# Patient Record
Sex: Male | Born: 1960 | Race: White | Hispanic: No | Marital: Married | Smoking: Current every day smoker
Health system: Southern US, Community
[De-identification: ages and names within clinical notes are randomized; demographics above are authoritative.]

## PROBLEM LIST (undated history)

## (undated) ENCOUNTER — Emergency Department (HOSPITAL_COMMUNITY): Discharge status: Against Medical Advice | Payer: Self-pay

## (undated) DIAGNOSIS — F32A Depression, unspecified: Secondary | ICD-10-CM

## (undated) DIAGNOSIS — G4730 Sleep apnea, unspecified: Secondary | ICD-10-CM

## (undated) DIAGNOSIS — N189 Chronic kidney disease, unspecified: Secondary | ICD-10-CM

## (undated) DIAGNOSIS — D649 Anemia, unspecified: Secondary | ICD-10-CM

## (undated) DIAGNOSIS — E785 Hyperlipidemia, unspecified: Secondary | ICD-10-CM

## (undated) DIAGNOSIS — E119 Type 2 diabetes mellitus without complications: Secondary | ICD-10-CM

## (undated) DIAGNOSIS — K635 Polyp of colon: Secondary | ICD-10-CM

## (undated) DIAGNOSIS — K649 Unspecified hemorrhoids: Secondary | ICD-10-CM

## (undated) DIAGNOSIS — J449 Chronic obstructive pulmonary disease, unspecified: Secondary | ICD-10-CM

## (undated) DIAGNOSIS — F419 Anxiety disorder, unspecified: Secondary | ICD-10-CM

## (undated) DIAGNOSIS — F329 Major depressive disorder, single episode, unspecified: Secondary | ICD-10-CM

## (undated) DIAGNOSIS — M1990 Unspecified osteoarthritis, unspecified site: Secondary | ICD-10-CM

## (undated) HISTORY — DX: Type 2 diabetes mellitus without complications: E11.9

## (undated) HISTORY — DX: Anxiety disorder, unspecified: F41.9

## (undated) HISTORY — DX: Major depressive disorder, single episode, unspecified: F32.9

## (undated) HISTORY — DX: Depression, unspecified: F32.A

## (undated) HISTORY — DX: Sleep apnea, unspecified: G47.30

## (undated) HISTORY — DX: Hyperlipidemia, unspecified: E78.5

## (undated) HISTORY — DX: Polyp of colon: K63.5

## (undated) HISTORY — DX: Unspecified hemorrhoids: K64.9

---

## 1982-06-22 HISTORY — PX: LIPOMA EXCISION: SHX5283

## 1986-06-22 HISTORY — PX: NASAL FRACTURE SURGERY: SHX718

## 2000-06-22 ENCOUNTER — Encounter: Payer: Self-pay | Admitting: Emergency Medicine

## 2000-06-22 ENCOUNTER — Emergency Department (HOSPITAL_COMMUNITY): Admission: EM | Admit: 2000-06-22 | Discharge: 2000-06-23 | Payer: Self-pay | Admitting: Emergency Medicine

## 2005-08-19 ENCOUNTER — Ambulatory Visit: Payer: Self-pay | Admitting: Internal Medicine

## 2007-01-15 ENCOUNTER — Emergency Department: Payer: Self-pay

## 2011-10-13 ENCOUNTER — Ambulatory Visit: Payer: Self-pay | Admitting: Family Medicine

## 2011-10-21 ENCOUNTER — Ambulatory Visit: Payer: Self-pay | Admitting: Family Medicine

## 2013-02-16 ENCOUNTER — Emergency Department: Payer: Self-pay | Admitting: Emergency Medicine

## 2013-02-18 LAB — BETA STREP CULTURE(ARMC)

## 2013-07-18 ENCOUNTER — Ambulatory Visit: Payer: Self-pay | Admitting: Family Medicine

## 2014-02-16 ENCOUNTER — Ambulatory Visit: Payer: Self-pay | Admitting: Gastroenterology

## 2014-06-22 DIAGNOSIS — K635 Polyp of colon: Secondary | ICD-10-CM

## 2014-06-22 HISTORY — DX: Polyp of colon: K63.5

## 2014-06-22 HISTORY — PX: COLONOSCOPY: SHX174

## 2014-09-22 DIAGNOSIS — R0789 Other chest pain: Secondary | ICD-10-CM | POA: Insufficient documentation

## 2014-09-22 DIAGNOSIS — F17200 Nicotine dependence, unspecified, uncomplicated: Secondary | ICD-10-CM | POA: Insufficient documentation

## 2014-09-22 DIAGNOSIS — G4733 Obstructive sleep apnea (adult) (pediatric): Secondary | ICD-10-CM | POA: Insufficient documentation

## 2014-09-22 DIAGNOSIS — E785 Hyperlipidemia, unspecified: Secondary | ICD-10-CM

## 2014-09-22 DIAGNOSIS — Z1331 Encounter for screening for depression: Secondary | ICD-10-CM | POA: Insufficient documentation

## 2014-09-22 DIAGNOSIS — R202 Paresthesia of skin: Secondary | ICD-10-CM | POA: Insufficient documentation

## 2014-09-22 DIAGNOSIS — D72820 Lymphocytosis (symptomatic): Secondary | ICD-10-CM | POA: Insufficient documentation

## 2014-09-22 DIAGNOSIS — Z23 Encounter for immunization: Secondary | ICD-10-CM | POA: Insufficient documentation

## 2014-09-22 DIAGNOSIS — E559 Vitamin D deficiency, unspecified: Secondary | ICD-10-CM | POA: Insufficient documentation

## 2014-09-22 DIAGNOSIS — E1169 Type 2 diabetes mellitus with other specified complication: Secondary | ICD-10-CM | POA: Insufficient documentation

## 2014-09-22 DIAGNOSIS — E669 Obesity, unspecified: Secondary | ICD-10-CM | POA: Insufficient documentation

## 2014-11-22 ENCOUNTER — Ambulatory Visit (INDEPENDENT_AMBULATORY_CARE_PROVIDER_SITE_OTHER): Payer: BLUE CROSS/BLUE SHIELD | Admitting: Family Medicine

## 2014-11-22 ENCOUNTER — Encounter: Payer: Self-pay | Admitting: Family Medicine

## 2014-11-22 VITALS — BP 100/68 | HR 92 | Resp 15 | Ht 69.5 in | Wt 209.4 lb

## 2014-11-22 DIAGNOSIS — J343 Hypertrophy of nasal turbinates: Secondary | ICD-10-CM

## 2014-11-22 DIAGNOSIS — H938X2 Other specified disorders of left ear: Secondary | ICD-10-CM | POA: Diagnosis not present

## 2014-11-22 DIAGNOSIS — E119 Type 2 diabetes mellitus without complications: Secondary | ICD-10-CM

## 2014-11-22 DIAGNOSIS — F329 Major depressive disorder, single episode, unspecified: Secondary | ICD-10-CM

## 2014-11-22 DIAGNOSIS — F419 Anxiety disorder, unspecified: Secondary | ICD-10-CM | POA: Diagnosis not present

## 2014-11-22 DIAGNOSIS — E785 Hyperlipidemia, unspecified: Secondary | ICD-10-CM | POA: Insufficient documentation

## 2014-11-22 DIAGNOSIS — F32A Depression, unspecified: Secondary | ICD-10-CM

## 2014-11-22 MED ORDER — CITALOPRAM HYDROBROMIDE 20 MG PO TABS: 20.0000 mg | ORAL_TABLET | Freq: Every day | ORAL | Status: DC

## 2014-11-22 MED ORDER — ROSUVASTATIN CALCIUM 20 MG PO TABS: 20.0000 mg | ORAL_TABLET | Freq: Every day | ORAL | Status: DC

## 2014-11-22 MED ORDER — FLUTICASONE PROPIONATE 50 MCG/ACT NA SUSP: 2.0000 | Freq: Every day | NASAL | Status: DC

## 2014-11-22 MED ORDER — ALPRAZOLAM 0.5 MG PO TABS: 0.5000 mg | ORAL_TABLET | Freq: Every day | ORAL | Status: DC | PRN

## 2014-11-22 MED ORDER — METFORMIN HCL 1000 MG PO TABS: 1000.0000 mg | ORAL_TABLET | Freq: Two times a day (BID) | ORAL | Status: DC

## 2014-11-22 NOTE — Progress Notes (Signed)
Subjective:    Patient ID: Trevor Young, male    DOB: [birth year], 54 y.o.   MRN: [id number]  Hyperlipidemia This is a chronic problem. Exacerbating diseases include diabetes and obesity. Pertinent negatives include no focal weakness, leg pain or shortness of breath. Current antihyperlipidemic treatment includes statins. The current treatment provides significant improvement of lipids. There are no compliance problems.  Risk factors for coronary artery disease include diabetes mellitus, dyslipidemia, male sex and obesity.  Anxiety Presents for follow-up visit. Symptoms include malaise and nervous/anxious behavior. Patient reports no dizziness, feeling of choking, obsessions, palpitations, panic, restlessness or shortness of breath. Symptoms occur occasionally. The severity of symptoms is mild. The symptoms are aggravated by work stress. The quality of sleep is good.   His past medical history is significant for depression. Past treatments include benzodiazephines and SSRIs. The treatment provided significant relief. Compliance with prior treatments has been good.  Diabetes He presents for his follow-up diabetic visit. He has type 2 diabetes mellitus. Hypoglycemia symptoms include nervousness/anxiousness. Pertinent negatives for hypoglycemia include no dizziness. Associated symptoms include polydipsia and polyuria. There are no hypoglycemic complications. Symptoms are stable. Pertinent negatives for diabetic complications include no autonomic neuropathy, CVA, heart disease or PVD. Risk factors for coronary artery disease include diabetes mellitus, dyslipidemia, male sex, obesity, stress and family history. Current diabetic treatment includes oral agent (monotherapy). He is compliant with treatment most of the time. He is following a diabetic diet. He has not had a previous visit with a dietitian. He rarely participates in exercise. He monitors blood glucose at home 1-2 x per day. There is no change in  his home blood glucose trend. His breakfast blood glucose is taken between 5-6 am. His breakfast blood glucose range is generally 110-130 mg/dl. An ACE inhibitor/angiotensin II receptor blocker is not being taken. He does not see a podiatrist.Eye exam is not current.  Ear Fullness  There is pain in the left ear. This is a recurrent problem. The current episode started more than 1 month ago. There has been no fever. The pain is at a severity of 1/10. The pain is mild. Pertinent negatives include no ear discharge or rhinorrhea. He has tried antibiotics for the symptoms. The treatment provided no relief. There is no history of a chronic ear infection, hearing loss or a tympanostomy tube.   Past Medical History  Diagnosis Date  . Hyperlipidemia   . Diabetes mellitus without complication   . Anxiety   . Depression    History reviewed. No pertinent past surgical history. Family History  Problem Relation Age of Onset  . Hypertension Mother   . Diabetes Mother   . Hypertension Father   . Diabetes Brother   . Diabetes Maternal Grandmother     History   Social History  . Marital Status: Married    Spouse Name: N/A  . Number of Children: N/A  . Years of Education: N/A   Occupational History  . Not on file.   Social History Main Topics  . Smoking status: Current Every Day Smoker -- 0.50 packs/day for 45 years  . Smokeless tobacco: Current User  . Alcohol Use: No  . Drug Use: No  . Sexual Activity: Not on file   Other Topics Concern  . Not on file   Social History Narrative     Review of Systems  HENT: Positive for ear pain. Negative for ear discharge and rhinorrhea.   Respiratory: Negative for shortness of breath.   Cardiovascular: Negative  for palpitations.  Endocrine: Positive for polydipsia and polyuria.  Neurological: Negative for dizziness and focal weakness.  Psychiatric/Behavioral: The patient is nervous/anxious.        Objective:   Physical Exam  Constitutional:  Vital signs are normal. He appears well-developed and well-nourished.  HENT:  Right Ear: External ear normal.  Left Ear: Hearing normal. No tenderness. No foreign bodies.  Mouth/Throat: Oropharynx is clear and moist.  eryhtematous L. external ear canal.  Left nasal turbinate erythematous and hypertrophied more than right nasal turbinate.  Cardiovascular: Normal rate, regular rhythm and normal heart sounds.   Pulmonary/Chest: Effort normal and breath sounds normal.  Neurological: He is alert.  Psychiatric: He has a normal mood and affect. His behavior is normal. Judgment and thought content normal.          Assessment & Plan:  1. Anxiety  - ALPRAZolam (XANAX) 0.5 MG tablet; Take 1 tablet (0.5 mg total) by mouth daily as needed.  Dispense: 30 tablet; Refill: 0  2. Type 2 diabetes mellitus without complication  - metFORMIN (GLUCOPHAGE) 1000 MG tablet; Take 1 tablet (1,000 mg total) by mouth 2 (two) times daily with a meal.  Dispense: 60 tablet; Refill: 2 - HgB A1c  3. Hyperlipidemia - rosuvastatin (CRESTOR) 20 MG tablet; Take 1 tablet (20 mg total) by mouth at bedtime.  Dispense: 30 tablet; Refill: 2 - Lipid Profile - Comp Met (CMET)  4. Depression  - citalopram (CELEXA) 20 MG tablet; Take 1 tablet (20 mg total) by mouth daily.  Dispense: 30 tablet; Refill: 2  5. Sensation of fullness in left ear Pt. Has been treated with antibiotics in the past, which has not helped relieve his symptoms. He will be referred to ENT for further evaluation. - Ambulatory referral to ENT  6. Nasal turbinate hypertrophy Pt. To use Flonase for 5 days for relief of nasal turbinate hypertrophy. Follow up with ENT. - fluticasone (FLONASE) 50 MCG/ACT nasal spray; Place 2 sprays into both nostrils daily.  Dispense: 16 g; Refill: 0

## 2014-12-04 ENCOUNTER — Telehealth: Payer: Self-pay

## 2014-12-04 DIAGNOSIS — F419 Anxiety disorder, unspecified: Secondary | ICD-10-CM

## 2014-12-04 NOTE — Telephone Encounter (Signed)
Spoke with patient to notify him of his ENT appointment while speaking with patient he wanted to know if I would ask if someone to could contact him in reference to why Dr. Sherryll Burger didn't refill his xanax.

## 2014-12-05 ENCOUNTER — Telehealth: Payer: Self-pay | Admitting: Family Medicine

## 2014-12-05 NOTE — Telephone Encounter (Signed)
Spoke with patients wife and told her that according to chart med was printed and gave to patient.  Wife reported that patient throw away rx.  Patients wife is going to try and find rx.

## 2014-12-05 NOTE — Telephone Encounter (Signed)
Pt has misplaced his Alprazolam 0.5mg . Patient remembers seeing the prescription but now can not locate it. Please advise [phone]

## 2014-12-06 MED ORDER — ALPRAZOLAM 0.5 MG PO TABS: 0.5000 mg | ORAL_TABLET | Freq: Every day | ORAL | Status: DC | PRN

## 2014-12-06 NOTE — Telephone Encounter (Signed)
This was a duplicate message.  I copy and paste this message and place it in first message.

## 2014-12-06 NOTE — Telephone Encounter (Signed)
Trevor Young at 12/05/2014 3:28 PM     Status: Signed       Expand All Collapse All   Pt has misplaced his Alprazolam 0.5mg . Patient remembers seeing the prescription but now can not locate it. Please advise [phone]
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
## 2015-02-19 ENCOUNTER — Telehealth: Payer: Self-pay | Admitting: Family Medicine

## 2015-02-19 DIAGNOSIS — J343 Hypertrophy of nasal turbinates: Secondary | ICD-10-CM

## 2015-02-19 MED ORDER — FLUTICASONE PROPIONATE 50 MCG/ACT NA SUSP: 2.0000 | Freq: Every day | NASAL | Status: DC

## 2015-02-19 NOTE — Telephone Encounter (Signed)
Fluticasone Propiona 50 MCG has been refilled and sent to [facility]

## 2015-02-26 ENCOUNTER — Ambulatory Visit: Payer: BLUE CROSS/BLUE SHIELD | Admitting: Family Medicine

## 2015-02-27 ENCOUNTER — Ambulatory Visit: Payer: BLUE CROSS/BLUE SHIELD | Admitting: Family Medicine

## 2015-03-04 ENCOUNTER — Ambulatory Visit: Payer: BLUE CROSS/BLUE SHIELD | Admitting: Family Medicine

## 2015-03-05 ENCOUNTER — Ambulatory Visit: Payer: BLUE CROSS/BLUE SHIELD | Admitting: Family Medicine

## 2015-03-05 ENCOUNTER — Telehealth: Payer: Self-pay | Admitting: Family Medicine

## 2015-03-05 DIAGNOSIS — F419 Anxiety disorder, unspecified: Secondary | ICD-10-CM

## 2015-03-05 DIAGNOSIS — F329 Major depressive disorder, single episode, unspecified: Secondary | ICD-10-CM

## 2015-03-05 DIAGNOSIS — F32A Depression, unspecified: Secondary | ICD-10-CM

## 2015-03-05 DIAGNOSIS — E119 Type 2 diabetes mellitus without complications: Secondary | ICD-10-CM

## 2015-03-05 DIAGNOSIS — E785 Hyperlipidemia, unspecified: Secondary | ICD-10-CM

## 2015-03-05 DIAGNOSIS — J343 Hypertrophy of nasal turbinates: Secondary | ICD-10-CM

## 2015-03-05 MED ORDER — CITALOPRAM HYDROBROMIDE 20 MG PO TABS: 20.0000 mg | ORAL_TABLET | Freq: Every day | ORAL | Status: DC

## 2015-03-05 MED ORDER — METFORMIN HCL 1000 MG PO TABS: 1000.0000 mg | ORAL_TABLET | Freq: Two times a day (BID) | ORAL | Status: DC

## 2015-03-05 MED ORDER — ALPRAZOLAM 0.5 MG PO TABS: 0.5000 mg | ORAL_TABLET | Freq: Every day | ORAL | Status: DC | PRN

## 2015-03-05 MED ORDER — FLUTICASONE PROPIONATE 50 MCG/ACT NA SUSP: 2.0000 | Freq: Every day | NASAL | Status: DC

## 2015-03-05 MED ORDER — ROSUVASTATIN CALCIUM 20 MG PO TABS: 20.0000 mg | ORAL_TABLET | Freq: Every day | ORAL | Status: DC

## 2015-03-05 NOTE — Telephone Encounter (Signed)
Medication has been refilled and sent to [facility]. °

## 2015-03-05 NOTE — Telephone Encounter (Signed)
Pt is currently out of all medications and has had to be rescheduled several times. He now has an appt on 03/19/15

## 2015-03-07 ENCOUNTER — Ambulatory Visit: Payer: BLUE CROSS/BLUE SHIELD | Admitting: Family Medicine

## 2015-03-19 ENCOUNTER — Ambulatory Visit (INDEPENDENT_AMBULATORY_CARE_PROVIDER_SITE_OTHER): Payer: BLUE CROSS/BLUE SHIELD | Admitting: Family Medicine

## 2015-03-19 ENCOUNTER — Encounter: Payer: Self-pay | Admitting: Family Medicine

## 2015-03-19 VITALS — BP 100/60 | HR 71 | Temp 98.2°F | Resp 17 | Ht 70.0 in | Wt 203.4 lb

## 2015-03-19 DIAGNOSIS — E119 Type 2 diabetes mellitus without complications: Secondary | ICD-10-CM | POA: Diagnosis not present

## 2015-03-19 DIAGNOSIS — E785 Hyperlipidemia, unspecified: Secondary | ICD-10-CM | POA: Diagnosis not present

## 2015-03-19 DIAGNOSIS — F419 Anxiety disorder, unspecified: Secondary | ICD-10-CM

## 2015-03-19 DIAGNOSIS — K429 Umbilical hernia without obstruction or gangrene: Secondary | ICD-10-CM | POA: Insufficient documentation

## 2015-03-19 DIAGNOSIS — F329 Major depressive disorder, single episode, unspecified: Secondary | ICD-10-CM | POA: Diagnosis not present

## 2015-03-19 DIAGNOSIS — F32A Depression, unspecified: Secondary | ICD-10-CM

## 2015-03-19 LAB — GLUCOSE, POCT (MANUAL RESULT ENTRY): POC Glucose: 102 mg/dl — AB (ref 70–99)

## 2015-03-19 LAB — POCT GLYCOSYLATED HEMOGLOBIN (HGB A1C): Hemoglobin A1C: 6

## 2015-03-19 MED ORDER — CITALOPRAM HYDROBROMIDE 20 MG PO TABS: 20.0000 mg | ORAL_TABLET | Freq: Every day | ORAL | Status: DC

## 2015-03-19 MED ORDER — METFORMIN HCL 1000 MG PO TABS: 1000.0000 mg | ORAL_TABLET | Freq: Two times a day (BID) | ORAL | Status: DC

## 2015-03-19 MED ORDER — ALPRAZOLAM 0.5 MG PO TABS: 0.5000 mg | ORAL_TABLET | Freq: Every day | ORAL | Status: DC | PRN

## 2015-03-19 MED ORDER — ROSUVASTATIN CALCIUM 20 MG PO TABS: 20.0000 mg | ORAL_TABLET | Freq: Every day | ORAL | Status: DC

## 2015-03-19 NOTE — Progress Notes (Signed)
Name: Trevor Young   MRN: [id number]    DOB: [birth year]   Date:03/19/2015       Progress Note  Subjective  Chief Complaint  Chief Complaint  Patient presents with  . Follow-up    3 mo  . Hyperlipidemia  . Depression  . COPD  . Anxiety  . Diabetes  . Medication Refill    Hyperlipidemia This is a chronic problem. The problem is controlled. Recent lipid tests were reviewed and are normal. Pertinent negatives include no shortness of breath. Current antihyperlipidemic treatment includes statins.  Depression      The patient presents with depression.  This is a chronic problem.  Associated symptoms include no fatigue, no helplessness, no hopelessness, does not have insomnia, not irritable, no appetite change and not sad.  Past treatments include SSRIs - Selective serotonin reuptake inhibitors.  Past medical history includes anxiety and depression.   Anxiety Presents for follow-up visit. Patient reports no depressed mood, excessive worry, insomnia, nervous/anxious behavior, panic or shortness of breath. The quality of sleep is good.   His past medical history is significant for depression. Past treatments include benzodiazephines. Compliance with prior treatments has been good.  Diabetes He presents for his follow-up diabetic visit. He has type 2 diabetes mellitus. His disease course has been stable. Pertinent negatives for hypoglycemia include no nervousness/anxiousness. Pertinent negatives for diabetes include no fatigue, no foot paresthesias, no polydipsia and no polyuria. Current diabetic treatment includes oral agent (monotherapy). He is following a diabetic diet. His breakfast blood glucose range is generally 130-140 mg/dl. His dinner blood glucose range is generally 130-140 mg/dl. An ACE inhibitor/angiotensin II receptor blocker is not being taken. He does not see a podiatrist.Eye exam is not current.    Past Medical History  Diagnosis Date  . Hyperlipidemia   . Diabetes mellitus  without complication   . Anxiety   . Depression     History reviewed. No pertinent past surgical history.  Family History  Problem Relation Age of Onset  . Hypertension Mother   . Diabetes Mother   . Hypertension Father   . Diabetes Brother   . Diabetes Maternal Grandmother     Social History   Social History  . Marital Status: Married    Spouse Name: N/A  . Number of Children: N/A  . Years of Education: N/A   Occupational History  . Not on file.   Social History Main Topics  . Smoking status: Current Every Day Smoker -- 0.50 packs/day for 45 years  . Smokeless tobacco: Current User  . Alcohol Use: No  . Drug Use: No  . Sexual Activity: Not on file   Other Topics Concern  . Not on file   Social History Narrative    Current outpatient prescriptions:  .  ALPRAZolam (XANAX) 0.5 MG tablet, Take 1 tablet (0.5 mg total) by mouth daily as needed., Disp: 30 tablet, Rfl: 0 .  citalopram (CELEXA) 20 MG tablet, Take 1 tablet (20 mg total) by mouth daily., Disp: 30 tablet, Rfl: 0 .  fluticasone (FLONASE) 50 MCG/ACT nasal spray, Place 2 sprays into both nostrils daily., Disp: 16 g, Rfl: 0 .  glucose blood test strip, 1 strip 2 (two) times daily. Use as directed to check BG BID, Disp: , Rfl:  .  metFORMIN (GLUCOPHAGE) 1000 MG tablet, Take 1 tablet (1,000 mg total) by mouth 2 (two) times daily with a meal., Disp: 60 tablet, Rfl: 2 .  rosuvastatin (CRESTOR) 20 MG tablet, Take 1  tablet (20 mg total) by mouth at bedtime., Disp: 30 tablet, Rfl: 2  Allergies  Allergen Reactions  . Sulfa Antibiotics      Review of Systems  Constitutional: Negative for appetite change and fatigue.  Respiratory: Negative for shortness of breath.   Endo/Heme/Allergies: Negative for polydipsia.  Psychiatric/Behavioral: Positive for depression. The patient is not nervous/anxious and does not have insomnia.    Objective  Filed Vitals:   03/19/15 1634  BP: 100/60  Pulse: 71  Temp: 98.2 F (36.8  C)  TempSrc: Oral  Resp: 17  Height:  (1.778 m)  Weight: 203 lb 6.4 oz (92.262 kg)  SpO2: 94%    Physical Exam  Constitutional: He is oriented to person, place, and time and well-developed, well-nourished, and in no distress. He is not irritable.  Cardiovascular: Normal rate and regular rhythm.   Pulmonary/Chest: Effort normal and breath sounds normal.  Abdominal: Soft. Bowel sounds are normal. A hernia is present. Hernia confirmed positive in the umbilical area.  Musculoskeletal: He exhibits no edema.  Neurological: He is alert and oriented to person, place, and time.  Nursing note and vitals reviewed.   Assessment & Plan  1. Type 2 diabetes mellitus without complication A1c of 6.0%, consistent with well-controlled diabetes. - metFORMIN (GLUCOPHAGE) 1000 MG tablet; Take 1 tablet (1,000 mg total) by mouth 2 (two) times daily with a meal.  Dispense: 180 tablet; Refill: 0 - Comprehensive Metabolic Panel (CMET) - POCT HgB A1C - POCT Glucose (CBG)  2. Anxiety Symptoms of anxiety are stable on daily benzodiazepine therapy. - ALPRAZolam (XANAX) 0.5 MG tablet; Take 1 tablet (0.5 mg total) by mouth daily as needed.  Dispense: 30 tablet; Refill: 2  3. Depression  - citalopram (CELEXA) 20 MG tablet; Take 1 tablet (20 mg total) by mouth daily.  Dispense: 90 tablet; Refill: 0  4. Hyperlipidemia  - rosuvastatin (CRESTOR) 20 MG tablet; Take 1 tablet (20 mg total) by mouth at bedtime.  Dispense: 90 tablet; Refill: 0 - Lipid Profile  5. Umbilical hernia without obstruction or gangrene  - Ambulatory referral to General Surgery   Syed Asad A. [facility] 03/19/2015 4:52 PM

## 2015-03-20 ENCOUNTER — Encounter: Payer: Self-pay | Admitting: *Deleted

## 2015-04-02 ENCOUNTER — Encounter: Payer: Self-pay | Admitting: General Surgery

## 2015-04-02 ENCOUNTER — Ambulatory Visit (INDEPENDENT_AMBULATORY_CARE_PROVIDER_SITE_OTHER): Payer: BLUE CROSS/BLUE SHIELD | Admitting: General Surgery

## 2015-04-02 VITALS — BP 130/68 | HR 78 | Resp 14 | Ht 68.0 in | Wt 204.0 lb

## 2015-04-02 DIAGNOSIS — K429 Umbilical hernia without obstruction or gangrene: Secondary | ICD-10-CM | POA: Diagnosis not present

## 2015-04-02 NOTE — Patient Instructions (Addendum)
Hernia, Adult A hernia is the bulging of an organ or tissue through a weak spot in the muscles of the abdomen (abdominal wall). Hernias develop most often near the navel or groin. There are many kinds of hernias. Common kinds include:  Femoral hernia. This kind of hernia develops under the groin in the upper thigh area.  Inguinal hernia. This kind of hernia develops in the groin or scrotum.  Umbilical hernia. This kind of hernia develops near the navel.  Hiatal hernia. This kind of hernia causes part of the stomach to be pushed up into the chest.  Incisional hernia. This kind of hernia bulges through a scar from an abdominal surgery. CAUSES This condition may be caused by:  Heavy lifting.  Coughing over a long period of time.  Straining to have a bowel movement.  An incision made during an abdominal surgery.  A birth defect (congenital defect).  Excess weight or obesity.  Smoking.  Poor nutrition.  Cystic fibrosis.  Excess fluid in the abdomen.  Undescended testicles. SYMPTOMS Symptoms of a hernia include:  A lump on the abdomen. This is the first sign of a hernia. The lump may become more obvious with standing, straining, or coughing. It may get bigger over time if it is not treated or if the condition causing it is not treated.  Pain. A hernia is usually painless, but it may become painful over time if treatment is delayed. The pain is usually dull and may get worse with standing or lifting heavy objects. Sometimes a hernia gets tightly squeezed in the weak spot (strangulated) or stuck there (incarcerated) and causes additional symptoms. These symptoms may include:  Vomiting.  Nausea.  Constipation.  Irritability. DIAGNOSIS A hernia may be diagnosed with:  A physical exam. During the exam your health care provider may ask you to cough or to make a specific movement, because a hernia is usually more visible when you move.  Imaging tests. These can  include:  X-rays.  Ultrasound.  CT scan. TREATMENT A hernia that is small and painless may not need to be treated. A hernia that is large or painful may be treated with surgery. Inguinal hernias may be treated with surgery to prevent incarceration or strangulation. Strangulated hernias are always treated with surgery, because lack of blood to the trapped organ or tissue can cause it to die. Surgery to treat a hernia involves pushing the bulge back into place and repairing the weak part of the abdomen. HOME CARE INSTRUCTIONS  Avoid straining.  Do not lift anything heavier than 10 lb (4.5 kg).  Lift with your leg muscles, not your back muscles. This helps avoid strain.  When coughing, try to cough gently.  Prevent constipation. Constipation leads to straining with bowel movements, which can make a hernia worse or cause a hernia repair to break down. You can prevent constipation by:  Eating a high-fiber diet that includes plenty of fruits and vegetables.  Drinking enough fluids to keep your urine clear or pale yellow. Aim to drink 6-8 glasses of water per day.  Using a stool softener as directed by your health care provider.  Lose weight, if you are overweight.  Do not use any tobacco products, including cigarettes, chewing tobacco, or electronic cigarettes. If you need help quitting, ask your health care provider.  Keep all follow-up visits as directed by your health care provider. This is important. Your health care provider may need to monitor your condition. SEEK MEDICAL CARE IF:  You have   swelling, redness, and pain in the affected area.  Your bowel habits change. SEEK IMMEDIATE MEDICAL CARE IF:  You have a fever.  You have abdominal pain that is getting worse.  You feel nauseous or you vomit.  You cannot push the hernia back in place by gently pressing on it while you are lying down.  The hernia:  Changes in shape or size.  Is stuck outside the  abdomen.  Becomes discolored.  Feels hard or tender.   This information is not intended to replace advice given to you by your health care provider. Make sure you discuss any questions you have with your health care provider.   Document Released: 06/08/2005 Document Revised: 06/29/2014 Document Reviewed: 04/18/2014 Elsevier Interactive Patient Education [employer]  The patient is scheduled for surgery at [facility] on 04/15/15. He will pre admit by phone. The patient is aware of date and instructions.

## 2015-04-02 NOTE — Progress Notes (Addendum)
Patient ID: Trevor Young, male   DOB: [birth year], 55 y.o.   MRN: [id number]  Chief Complaint  Patient presents with  . Hernia    HPI Trevor Young is a 54 y.o. male.  Here for evaluation of a possible umbilical hernia. Bowels area regular and daily. He states he first noticed it several years ago. He states it has gotten larger over the past year and more tender. He states when he bumps it he will get nauseates.   HPI  Past Medical History  Diagnosis Date  . Hyperlipidemia   . Diabetes mellitus without complication (HCC)   . Anxiety   . Depression   . Hemorrhoid   . Colon polyp 2016  . Sleep apnea     Uses C-Pap machine     Past Surgical History  Procedure Laterality Date  . Lipoma excision  1984  . Nasal fracture surgery  1988  . Colonoscopy  2016    Family History  Problem Relation Age of Onset  . Hypertension Mother   . Diabetes Mother   . Hypertension Father   . Diabetes Brother   . Diabetes Maternal Grandmother     Social History Social History  Substance Use Topics  . Smoking status: Current Every Day Smoker -- 1.00 packs/day for 45 years    Types: Cigarettes  . Smokeless tobacco: Current User  . Alcohol Use: 0.0 oz/week    0 Standard drinks or equivalent per week     Comment: occasionally    Allergies  Allergen Reactions  . Sulfa Antibiotics     Current Outpatient Prescriptions  Medication Sig Dispense Refill  . ALPRAZolam (XANAX) 0.5 MG tablet Take 1 tablet (0.5 mg total) by mouth daily as needed. 30 tablet 2  . citalopram (CELEXA) 20 MG tablet Take 1 tablet (20 mg total) by mouth daily. 90 tablet 0  . fluticasone (FLONASE) 50 MCG/ACT nasal spray Place 2 sprays into both nostrils daily. 16 g 0  . glucose blood test strip 1 strip 2 (two) times daily. Use as directed to check BG BID    . metFORMIN (GLUCOPHAGE) 1000 MG tablet Take 1 tablet (1,000 mg total) by mouth 2 (two) times daily with a meal. 180 tablet 0  . rosuvastatin (CRESTOR) 20 MG tablet  Take 1 tablet (20 mg total) by mouth at bedtime. 90 tablet 0   No current facility-administered medications for this visit.    Review of Systems Review of Systems  Constitutional: Negative.   Respiratory: Positive for shortness of breath.   Cardiovascular: Negative.   Gastrointestinal: Negative for diarrhea and constipation.    Blood pressure 130/68, pulse 78, resp. rate 14, height  (1.727 m), weight 204 lb (92.534 kg).  Physical Exam Physical Exam  Constitutional: He is oriented to person, place, and time. He appears well-developed and well-nourished.  HENT:  Mouth/Throat: Oropharynx is clear and moist.  Eyes: Conjunctivae are normal. No scleral icterus.  Neck: Neck supple.  Cardiovascular: Normal rate, regular rhythm and normal heart sounds.   Pulmonary/Chest: Effort normal and breath sounds normal.  Abdominal: Soft. Normal appearance and bowel sounds are normal. A hernia (umbilical) is present. Hernia confirmed negative in the right inguinal area and confirmed negative in the left inguinal area.  Neurological: He is alert and oriented to person, place, and time.  Skin: Skin is warm and dry.  Psychiatric: His behavior is normal.    Data Reviewed None.  Assessment    Umbilical hernia without strangulation or  gangrene    Plan    Hernia precautions and incarceration were discussed with the patient. If they develop symptoms of an incarcerated hernia, they were encouraged to seek prompt medical attention.  I have recommended repair of the hernia possibly using mesh on an outpatient basis in the near future. The risk of infection was reviewed. The role of prosthetic mesh to minimize the risk of recurrence was reviewed.     The patient [facility]. He will pre admit by phone. The patient is aware of date and instructions.   PCP:  Carnella GuadalajaraShah, Syed Asad A  SANKAR,SEEPLAPUTHUR G 04/10/2015, 3:33 PM

## 2015-04-09 ENCOUNTER — Other Ambulatory Visit: Payer: Self-pay

## 2015-04-11 ENCOUNTER — Encounter: Payer: Self-pay | Admitting: *Deleted

## 2015-04-11 NOTE — Patient Instructions (Signed)
  Your procedure is scheduled on: 04-15-15 Report to [facility] 2ND FLOOR To find out your arrival time please call [phone] between 1PM - 3PM on 04-12-15  Remember: Instructions that are not followed completely may result in serious medical risk, up to and including death, or upon the discretion of your surgeon and anesthesiologist your surgery may need to be rescheduled.    _X___ 1. Do not eat food or drink liquids after midnight. No gum chewing or hard candies.     _X___ 2. No Alcohol for 24 hours before or after surgery.   ____ 3. Bring all medications with you on the day of surgery if instructed.    ____ 4. Notify your doctor if there is any change in your medical condition     (cold, fever, infections).     Do not wear jewelry, make-up, hairpins, clips or nail polish.  Do not wear lotions, powders, or perfumes. You may wear deodorant.  Do not shave 48 hours prior to surgery. Men may shave face and neck.  Do not bring valuables to the hospital.    [facility] is not responsible for any belongings or valuables.               Contacts, dentures or bridgework may not be worn into surgery.  Leave your suitcase in the car. After surgery it may be brought to your room.  For patients admitted to the hospital, discharge time is determined by your treatment team.   Patients discharged the day of surgery will not be allowed to drive home.   Please read over the following fact sheets that you were given:      _X___ Take these medicines the morning of surgery with A SIP OF WATER:    1. CITALOPRAM  2.   3.   4.  5.  6.  ____ Fleet Enema (as directed)   ____ Use CHG Soap as directed  ____ Use inhalers on the day of surgery  _X___ Stop metformin 2 days prior to surgery-LAST DOSE ON 04-12-15 (FRIDAY)    ____ Take 1/2 of usual insulin dose the night before surgery and none on the morning of surgery.   ____ Stop Coumadin/Plavix/aspirin-N/A  ____ Stop  Anti-inflammatories-NO NSAIDS OR ASA PRODUCTS-TYLENOL OK   ____ Stop supplements until after surgery.    ____ Bring C-Pap to the hospital.

## 2015-04-15 ENCOUNTER — Ambulatory Visit
Admission: RE | Admit: 2015-04-15 | Discharge: 2015-04-15 | Disposition: A | Discharge status: Home / Self Care | Payer: BLUE CROSS/BLUE SHIELD | Source: Ambulatory Visit | Attending: General Surgery | Admitting: General Surgery

## 2015-04-15 ENCOUNTER — Encounter: Payer: Self-pay | Admitting: Anesthesiology

## 2015-04-15 ENCOUNTER — Ambulatory Visit: Payer: BLUE CROSS/BLUE SHIELD | Admitting: Anesthesiology

## 2015-04-15 ENCOUNTER — Encounter
Admission: RE | Disposition: A | Discharge status: Home / Self Care | Payer: Self-pay | Source: Ambulatory Visit | Attending: General Surgery

## 2015-04-15 DIAGNOSIS — G4730 Sleep apnea, unspecified: Secondary | ICD-10-CM | POA: Diagnosis not present

## 2015-04-15 DIAGNOSIS — Z9889 Other specified postprocedural states: Secondary | ICD-10-CM | POA: Insufficient documentation

## 2015-04-15 DIAGNOSIS — F329 Major depressive disorder, single episode, unspecified: Secondary | ICD-10-CM | POA: Insufficient documentation

## 2015-04-15 DIAGNOSIS — Z8249 Family history of ischemic heart disease and other diseases of the circulatory system: Secondary | ICD-10-CM | POA: Insufficient documentation

## 2015-04-15 DIAGNOSIS — K429 Umbilical hernia without obstruction or gangrene: Secondary | ICD-10-CM | POA: Diagnosis not present

## 2015-04-15 DIAGNOSIS — E119 Type 2 diabetes mellitus without complications: Secondary | ICD-10-CM | POA: Insufficient documentation

## 2015-04-15 DIAGNOSIS — Z86010 Personal history of colonic polyps: Secondary | ICD-10-CM | POA: Insufficient documentation

## 2015-04-15 DIAGNOSIS — Z7984 Long term (current) use of oral hypoglycemic drugs: Secondary | ICD-10-CM | POA: Diagnosis not present

## 2015-04-15 DIAGNOSIS — Z833 Family history of diabetes mellitus: Secondary | ICD-10-CM | POA: Diagnosis not present

## 2015-04-15 DIAGNOSIS — E785 Hyperlipidemia, unspecified: Secondary | ICD-10-CM | POA: Diagnosis not present

## 2015-04-15 DIAGNOSIS — Z79899 Other long term (current) drug therapy: Secondary | ICD-10-CM | POA: Diagnosis not present

## 2015-04-15 DIAGNOSIS — M1990 Unspecified osteoarthritis, unspecified site: Secondary | ICD-10-CM | POA: Diagnosis not present

## 2015-04-15 DIAGNOSIS — F17210 Nicotine dependence, cigarettes, uncomplicated: Secondary | ICD-10-CM | POA: Insufficient documentation

## 2015-04-15 DIAGNOSIS — F419 Anxiety disorder, unspecified: Secondary | ICD-10-CM | POA: Diagnosis not present

## 2015-04-15 HISTORY — DX: Anemia, unspecified: D64.9

## 2015-04-15 HISTORY — DX: Unspecified osteoarthritis, unspecified site: M19.90

## 2015-04-15 HISTORY — DX: Chronic obstructive pulmonary disease, unspecified: J44.9

## 2015-04-15 HISTORY — PX: UMBILICAL HERNIA REPAIR: SHX196

## 2015-04-15 HISTORY — DX: Chronic kidney disease, unspecified: N18.9

## 2015-04-15 LAB — GLUCOSE, CAPILLARY
Glucose-Capillary: 145 mg/dL — ABNORMAL HIGH (ref 65–99)
Glucose-Capillary: 132 mg/dL — ABNORMAL HIGH (ref 65–99)

## 2015-04-15 SURGERY — REPAIR, HERNIA, UMBILICAL, ADULT
Anesthesia: General | Wound class: Clean

## 2015-04-15 MED ORDER — ACETAMINOPHEN 10 MG/ML IV SOLN
INTRAVENOUS | Status: AC
  Filled 2015-04-15: qty 100

## 2015-04-15 MED ORDER — CEFAZOLIN SODIUM-DEXTROSE 2-3 GM-% IV SOLR
2.0000 g | INTRAVENOUS | Status: AC
  Administered 2015-04-15 (×2): 2 g via INTRAVENOUS

## 2015-04-15 MED ORDER — ONDANSETRON HCL 4 MG PO TABS: 4.0000 mg | ORAL_TABLET | Freq: Three times a day (TID) | ORAL | Status: DC | PRN

## 2015-04-15 MED ORDER — IPRATROPIUM-ALBUTEROL 0.5-2.5 (3) MG/3ML IN SOLN: 3.0000 mL | Freq: Once | RESPIRATORY_TRACT | Status: DC | PRN

## 2015-04-15 MED ORDER — GLYCOPYRROLATE 0.2 MG/ML IJ SOLN
INTRAMUSCULAR | Status: DC | PRN
  Administered 2015-04-15: 0.2 mg via INTRAVENOUS

## 2015-04-15 MED ORDER — DEXAMETHASONE SODIUM PHOSPHATE 4 MG/ML IJ SOLN
INTRAMUSCULAR | Status: DC | PRN
  Administered 2015-04-15: 5 mg via INTRAVENOUS

## 2015-04-15 MED ORDER — CHLORHEXIDINE GLUCONATE 4 % EX LIQD: 1.0000 "application " | Freq: Once | CUTANEOUS | Status: DC

## 2015-04-15 MED ORDER — ONDANSETRON HCL 4 MG/2ML IJ SOLN
INTRAMUSCULAR | Status: DC | PRN
  Administered 2015-04-15: 4 mg via INTRAVENOUS

## 2015-04-15 MED ORDER — IPRATROPIUM-ALBUTEROL 0.5-2.5 (3) MG/3ML IN SOLN
RESPIRATORY_TRACT | Status: AC
  Administered 2015-04-15: 3 mL via RESPIRATORY_TRACT
  Filled 2015-04-15: qty 3

## 2015-04-15 MED ORDER — OXYCODONE-ACETAMINOPHEN 5-325 MG PO TABS: 1.0000 | ORAL_TABLET | ORAL | Status: DC | PRN

## 2015-04-15 MED ORDER — ACETAMINOPHEN 10 MG/ML IV SOLN
INTRAVENOUS | Status: DC | PRN
  Administered 2015-04-15: 1000 mg via INTRAVENOUS

## 2015-04-15 MED ORDER — IPRATROPIUM-ALBUTEROL 0.5-2.5 (3) MG/3ML IN SOLN
3.0000 mL | Freq: Once | RESPIRATORY_TRACT | Status: AC
  Administered 2015-04-15: 3 mL via RESPIRATORY_TRACT

## 2015-04-15 MED ORDER — BUPIVACAINE HCL (PF) 0.5 % IJ SOLN
INTRAMUSCULAR | Status: AC
  Filled 2015-04-15: qty 30

## 2015-04-15 MED ORDER — CEFAZOLIN SODIUM-DEXTROSE 2-3 GM-% IV SOLR
INTRAVENOUS | Status: AC
  Administered 2015-04-15: 2 g via INTRAVENOUS
  Filled 2015-04-15: qty 50

## 2015-04-15 MED ORDER — PROPOFOL 10 MG/ML IV BOLUS
INTRAVENOUS | Status: DC | PRN
  Administered 2015-04-15: 200 mg via INTRAVENOUS

## 2015-04-15 MED ORDER — BUPIVACAINE HCL (PF) 0.5 % IJ SOLN
INTRAMUSCULAR | Status: DC | PRN
  Administered 2015-04-15: 20 mL

## 2015-04-15 MED ORDER — FENTANYL CITRATE (PF) 100 MCG/2ML IJ SOLN: 25.0000 ug | INTRAMUSCULAR | Status: DC | PRN

## 2015-04-15 MED ORDER — FAMOTIDINE 20 MG PO TABS
20.0000 mg | ORAL_TABLET | Freq: Once | ORAL | Status: AC
  Administered 2015-04-15: 20 mg via ORAL

## 2015-04-15 MED ORDER — FAMOTIDINE 20 MG PO TABS
ORAL_TABLET | ORAL | Status: AC
  Administered 2015-04-15: 20 mg via ORAL
  Filled 2015-04-15: qty 1

## 2015-04-15 MED ORDER — MIDAZOLAM HCL 2 MG/2ML IJ SOLN
INTRAMUSCULAR | Status: DC | PRN
  Administered 2015-04-15: 2 mg via INTRAVENOUS

## 2015-04-15 MED ORDER — FENTANYL CITRATE (PF) 100 MCG/2ML IJ SOLN
INTRAMUSCULAR | Status: DC | PRN
  Administered 2015-04-15 (×4): 50 ug via INTRAVENOUS

## 2015-04-15 MED ORDER — KETOROLAC TROMETHAMINE 30 MG/ML IJ SOLN
INTRAMUSCULAR | Status: DC | PRN
  Administered 2015-04-15: 30 mg via INTRAVENOUS

## 2015-04-15 MED ORDER — SODIUM CHLORIDE 0.9 % IV SOLN
INTRAVENOUS | Status: DC
  Administered 2015-04-15: 13:00:00 via INTRAVENOUS

## 2015-04-15 MED ORDER — LIDOCAINE HCL (CARDIAC) 20 MG/ML IV SOLN
INTRAVENOUS | Status: DC | PRN
  Administered 2015-04-15: 50 mg via INTRAVENOUS

## 2015-04-15 SURGICAL SUPPLY — 25 items
BLADE CLIPPER SURG (BLADE) ×2 IMPLANT
BLADE SURG 15 STRL SS SAFETY (BLADE) ×3 IMPLANT
CANISTER SUCT 1200ML W/VALVE (MISCELLANEOUS) ×3 IMPLANT
CHLORAPREP W/TINT 26ML (MISCELLANEOUS) ×3 IMPLANT
DECANTER SPIKE VIAL GLASS SM (MISCELLANEOUS) ×4 IMPLANT
DRAPE LAPAROTOMY 100X77 ABD (DRAPES) ×3 IMPLANT
GLOVE BIO SURGEON STRL SZ7 (GLOVE) ×12 IMPLANT
GOWN STRL REUS W/ TWL LRG LVL3 (GOWN DISPOSABLE) ×2 IMPLANT
GOWN STRL REUS W/TWL LRG LVL3 (GOWN DISPOSABLE) ×9
KIT RM TURNOVER STRD PROC AR (KITS) ×3 IMPLANT
LABEL OR SOLS (LABEL) ×1 IMPLANT
LIQUID BAND (GAUZE/BANDAGES/DRESSINGS) ×3 IMPLANT
NDL HYPO 25X1 1.5 SAFETY (NEEDLE) ×1 IMPLANT
NEEDLE HYPO 25X1 1.5 SAFETY (NEEDLE) ×3 IMPLANT
NS IRRIG 500ML POUR BTL (IV SOLUTION) ×3 IMPLANT
PACK BASIN MINOR ARMC (MISCELLANEOUS) ×3 IMPLANT
PAD GROUND ADULT SPLIT (MISCELLANEOUS) ×3 IMPLANT
SUT PROLENE 0 CT 2 (SUTURE) ×6 IMPLANT
SUT VIC AB 2-0 SH 27 (SUTURE) ×3
SUT VIC AB 2-0 SH 27XBRD (SUTURE) ×1 IMPLANT
SUT VIC AB 3-0 SH 27 (SUTURE) ×3
SUT VIC AB 3-0 SH 27X BRD (SUTURE) ×1 IMPLANT
SUT VIC AB 4-0 FS2 27 (SUTURE) ×3 IMPLANT
SUT VICRYL+ 3-0 144IN (SUTURE) ×3 IMPLANT
SYR CONTROL 10ML (SYRINGE) ×3 IMPLANT

## 2015-04-15 NOTE — Interval H&P Note (Signed)
History and Physical Interval Note:  04/15/2015 1:54 PM  Trevor Young  has presented today for surgery, with the diagnosis of umbilical hernia  The various methods of treatment have been discussed with the patient and family. After consideration of risks, benefits and other options for treatment, the patient has consented to  Procedure(s): HERNIA REPAIR UMBILICAL ADULT (N/A) as a surgical intervention .  The patient's history has been reviewed, patient examined, no change in status, stable for surgery.  I have reviewed the patient's chart and labs.  Questions were answered to the patient's satisfaction.     Mindie Rawdon G

## 2015-04-15 NOTE — Anesthesia Postprocedure Evaluation (Signed)
  Anesthesia Post-op Note  Patient: Trevor Young  Procedure(s) Performed: Procedure(s): HERNIA REPAIR UMBILICAL ADULT (N/A)  Anesthesia type:General ETT  Patient location: PACU  Post pain: Pain level controlled  Post assessment: Post-op Vital signs reviewed, Patient's Cardiovascular Status Stable, Respiratory Function Stable, Patent Airway and No signs of Nausea or vomiting  Post vital signs: Reviewed and stable  Last Vitals:  Filed Vitals:   04/15/15 1624  BP: 112/85  Pulse: 75  Temp: 36.8 C  Resp: 16    Level of consciousness: awake, alert  and patient cooperative  Complications: No apparent anesthesia complications

## 2015-04-15 NOTE — Discharge Instructions (Signed)

## 2015-04-15 NOTE — Anesthesia Procedure Notes (Signed)
Procedure Name: LMA Insertion Performed by: Kahla Risdon Pre-anesthesia Checklist: Patient identified, Patient being monitored, Timeout performed, Emergency Drugs available and Suction available Patient Re-evaluated:Patient Re-evaluated prior to inductionOxygen Delivery Method: Circle system utilized Preoxygenation: Pre-oxygenation with 100% oxygen Intubation Type: IV induction Ventilation: Mask ventilation without difficulty LMA: LMA inserted LMA Size: 4.0 Tube type: Oral Number of attempts: 1 Placement Confirmation: positive ETCO2 and breath sounds checked- equal and bilateral Tube secured with: Tape Dental Injury: Teeth and Oropharynx as per pre-operative assessment      

## 2015-04-15 NOTE — Anesthesia Preprocedure Evaluation (Signed)
Anesthesia Evaluation  Patient identified by MRN, date of birth, ID band Patient awake    Reviewed: Allergy & Precautions, H&P , NPO status , Patient's Chart, lab work & pertinent test results  History of Anesthesia Complications Negative for: history of anesthetic complications  Airway Mallampati: II  TM Distance: >3 FB Neck ROM: full    Dental  (+) Poor Dentition, Chipped   Pulmonary neg shortness of breath, sleep apnea , COPD, Current Smoker,    Pulmonary exam normal breath sounds clear to auscultation       Cardiovascular Exercise Tolerance: Good (-) angina(-) Past MI and (-) DOE Normal cardiovascular exam Rhythm:regular Rate:Normal     Neuro/Psych PSYCHIATRIC DISORDERS Anxiety Depression negative neurological ROS     GI/Hepatic negative GI ROS, Neg liver ROS,   Endo/Other  diabetes, Type 2, Oral Hypoglycemic Agents  Renal/GU Renal disease  negative genitourinary   Musculoskeletal  (+) Arthritis ,   Abdominal   Peds  Hematology negative hematology ROS (+)   Anesthesia Other Findings Past Medical History:   Hyperlipidemia                                               Diabetes mellitus without complication (HCC)                 Anxiety                                                      Depression                                                   Hemorrhoid                                                   Colon polyp                                     2016         Sleep apnea                                                    Comment:Uses C-Pap machine    COPD (chronic obstructive pulmonary disease) (*                Comment:no inhalers   Chronic kidney disease                                         Comment:kidney stone    Arthritis  Comment:hands   Anemia                                                         Comment:h/o  Past Surgical History:   LIPOMA EXCISION                                  1984         NASAL FRACTURE SURGERY                           1988         COLONOSCOPY                                      2016           Reproductive/Obstetrics negative OB ROS                             Anesthesia Physical Anesthesia Plan  ASA: III  Anesthesia Plan: General ETT   Post-op Pain Management:    Induction:   Airway Management Planned:   Additional Equipment:   Intra-op Plan:   Post-operative Plan:   Informed Consent: I have reviewed the patients History and Physical, chart, labs and discussed the procedure including the risks, benefits and alternatives for the proposed anesthesia with the patient or authorized representative who has indicated his/her understanding and acceptance.   Dental Advisory Given  Plan Discussed with: Anesthesiologist, CRNA and Surgeon  Anesthesia Plan Comments:         Anesthesia Quick Evaluation

## 2015-04-15 NOTE — H&P (View-Only) (Signed)
Patient ID: Trevor Young, male   DOB: 1960-10-14, 54 y.o.   MRN: [id number]  Chief Complaint  Patient presents with  . Hernia    HPI Trevor Young is a 54 y.o. male.  Here for evaluation of a possible umbilical hernia. Bowels area regular and daily. He states he first noticed it several years ago. He states it has gotten larger over the past year and more tender. He states when he bumps it he will get nauseates.   HPI  Past Medical History  Diagnosis Date  . Hyperlipidemia   . Diabetes mellitus without complication (HCC)   . Anxiety   . Depression   . Hemorrhoid   . Colon polyp 2016  . Sleep apnea     Uses C-Pap machine     Past Surgical History  Procedure Laterality Date  . Lipoma excision  1984  . Nasal fracture surgery  1988  . Colonoscopy  2016    Family History  Problem Relation Age of Onset  . Hypertension Mother   . Diabetes Mother   . Hypertension Father   . Diabetes Brother   . Diabetes Maternal Grandmother     Social History Social History  Substance Use Topics  . Smoking status: Current Every Day Smoker -- 1.00 packs/day for 45 years    Types: Cigarettes  . Smokeless tobacco: Current User  . Alcohol Use: 0.0 oz/week    0 Standard drinks or equivalent per week     Comment: occasionally    Allergies  Allergen Reactions  . Sulfa Antibiotics     Current Outpatient Prescriptions  Medication Sig Dispense Refill  . ALPRAZolam (XANAX) 0.5 MG tablet Take 1 tablet (0.5 mg total) by mouth daily as needed. 30 tablet 2  . citalopram (CELEXA) 20 MG tablet Take 1 tablet (20 mg total) by mouth daily. 90 tablet 0  . fluticasone (FLONASE) 50 MCG/ACT nasal spray Place 2 sprays into both nostrils daily. 16 g 0  . glucose blood test strip 1 strip 2 (two) times daily. Use as directed to check BG BID    . metFORMIN (GLUCOPHAGE) 1000 MG tablet Take 1 tablet (1,000 mg total) by mouth 2 (two) times daily with a meal. 180 tablet 0  . rosuvastatin (CRESTOR) 20 MG tablet  Take 1 tablet (20 mg total) by mouth at bedtime. 90 tablet 0   No current facility-administered medications for this visit.    Review of Systems Review of Systems  Constitutional: Negative.   Respiratory: Positive for shortness of breath.   Cardiovascular: Negative.   Gastrointestinal: Negative for diarrhea and constipation.    Blood pressure 130/68, pulse 78, resp. rate 14, height  (1.727 m), weight 204 lb (92.534 kg).  Physical Exam Physical Exam  Constitutional: He is oriented to person, place, and time. He appears well-developed and well-nourished.  HENT:  Mouth/Throat: Oropharynx is clear and moist.  Eyes: Conjunctivae are normal. No scleral icterus.  Neck: Neck supple.  Cardiovascular: Normal rate, regular rhythm and normal heart sounds.   Pulmonary/Chest: Effort normal and breath sounds normal.  Abdominal: Soft. Normal appearance and bowel sounds are normal. A hernia (umbilical) is present. Hernia confirmed negative in the right inguinal area and confirmed negative in the left inguinal area.  Neurological: He is alert and oriented to person, place, and time.  Skin: Skin is warm and dry.  Psychiatric: His behavior is normal.    Data Reviewed None.  Assessment    Umbilical hernia without strangulation or  gangrene    Plan    Hernia precautions and incarceration were discussed with the patient. If they develop symptoms of an incarcerated hernia, they were encouraged to seek prompt medical attention.  I have recommended repair of the hernia possibly using mesh on an outpatient basis in the near future. The risk of infection was reviewed. The role of prosthetic mesh to minimize the risk of recurrence was reviewed.     [facility]. He will pre admit by phone. The patient is aware of date and instructions.   PCP:  Carnella GuadalajaraShah, Syed Asad A  SANKAR,SEEPLAPUTHUR G 04/10/2015, 3:33 PM

## 2015-04-15 NOTE — Op Note (Signed)
Preop diagnosis: Umbilical hernia  Post op diagnosis: Same  Operation: Repair umbilical hernia  Surgeon: S.G.Sankar  Assistant:     Anesthesia: Gen.  Complications: None  EBL: Minimal  Drains: None  Description: Patient was put to sleep in the supine position the operating table. The umbilical area was prepped and draped out as a sterile field. Timeout was performed. The hernia was located near the inferior lip of the umbilicus a curved incision along the inferior lip was planned. Percent Marcaine totaling 20 mL was instilled for postop analgesia. Skin incision was made and the subcutaneous tissue the hernias protrusion was identified. The hernia was freed from the skin on the umbilical side and from the subcutaneous tissue and the lateral and inferior aspects. It was striped peritoneal sac and preperitoneal tissue with contents of a sleeve of omentum about the 2 inches long within it. The sleeve of omentum was amputated and ligated with 3-0 Vicryl and the area pushed back into the peritoneal cavity. The sac was excised out flush with the fascial opening which only about three quarters of a centimeter to a centimeter in size. The fascial edges were approximated with the 2 stitches of 0 proline. The subcutaneous tissue was approximated with the 3-0 Vicryl. Skin closed with subcuticular 4-0 Vicryl covered with liqui  ban. Patient subsequently was returned recovery room stable condition.

## 2015-04-15 NOTE — Transfer of Care (Signed)
Immediate Anesthesia Transfer of Care Note  Patient: Trevor Young  Procedure(s) Performed: Procedure(s): HERNIA REPAIR UMBILICAL ADULT (N/A)  Patient Location: PACU  Anesthesia Type:General  Level of Consciousness: sedated  Airway & Oxygen Therapy: Patient Spontanous Breathing and Patient connected to face mask oxygen  Post-op Assessment: Report given to RN  Post vital signs: Reviewed  Last Vitals:  Filed Vitals:   04/15/15 1458  BP: 133/77  Pulse: 69  Temp: 36.8 C  Resp: 12    Complications: No apparent anesthesia complications

## 2015-04-16 ENCOUNTER — Encounter: Payer: Self-pay | Admitting: General Surgery

## 2015-04-25 ENCOUNTER — Ambulatory Visit (INDEPENDENT_AMBULATORY_CARE_PROVIDER_SITE_OTHER): Payer: BLUE CROSS/BLUE SHIELD | Admitting: General Surgery

## 2015-04-25 ENCOUNTER — Encounter: Payer: Self-pay | Admitting: General Surgery

## 2015-04-25 VITALS — BP 132/70 | HR 76 | Resp 16 | Ht 69.0 in | Wt 206.0 lb

## 2015-04-25 DIAGNOSIS — K429 Umbilical hernia without obstruction or gangrene: Secondary | ICD-10-CM

## 2015-04-25 NOTE — Patient Instructions (Addendum)
The patient is aware to call back for any questions or concerns.  Follow up in one month.

## 2015-04-25 NOTE — Progress Notes (Signed)
Here today for postoperative visit, umbilical hernia repair on 04-15-15. Repair was done without mesh, fascial defect being less than 1 cm in size. States he is doing well. Bowels moving regularly. I have reviewed the history of present illness with the patient.  Incision healing well, no signs of infection. Abdomen is soft and non tender.   Patient may increase activity in one week, and return to work Monday 04/29/2015. Follow up in one month.

## 2015-05-27 ENCOUNTER — Encounter: Payer: Self-pay | Admitting: General Surgery

## 2015-05-29 ENCOUNTER — Encounter: Payer: Self-pay | Admitting: General Surgery

## 2015-05-29 ENCOUNTER — Ambulatory Visit (INDEPENDENT_AMBULATORY_CARE_PROVIDER_SITE_OTHER): Payer: BLUE CROSS/BLUE SHIELD | Admitting: General Surgery

## 2015-05-29 VITALS — BP 130/72 | HR 78 | Resp 12 | Ht 68.0 in | Wt 206.0 lb

## 2015-05-29 DIAGNOSIS — K429 Umbilical hernia without obstruction or gangrene: Secondary | ICD-10-CM

## 2015-05-29 NOTE — Progress Notes (Signed)
Here today for postoperative visit, umbilical hernia repair on 04-15-15. Repair was done without mesh. States he is doing well. Bowels moving regularly. I have reviewed the history of present illness with the patient.   Incision healed well, no signs of infection. No hernia noted. Abdomen is soft and non tender.  Patient to return as needed.

## 2015-05-29 NOTE — Patient Instructions (Signed)
Patient to return as needed. 

## 2015-05-31 ENCOUNTER — Encounter: Payer: Self-pay | Admitting: General Surgery

## 2015-06-18 ENCOUNTER — Ambulatory Visit (INDEPENDENT_AMBULATORY_CARE_PROVIDER_SITE_OTHER): Payer: BLUE CROSS/BLUE SHIELD | Admitting: Family Medicine

## 2015-06-18 ENCOUNTER — Encounter: Payer: Self-pay | Admitting: Family Medicine

## 2015-06-18 VITALS — BP 128/74 | HR 83 | Temp 98.7°F | Resp 15 | Ht 68.0 in | Wt 205.2 lb

## 2015-06-18 DIAGNOSIS — J329 Chronic sinusitis, unspecified: Secondary | ICD-10-CM

## 2015-06-18 DIAGNOSIS — E785 Hyperlipidemia, unspecified: Secondary | ICD-10-CM | POA: Diagnosis not present

## 2015-06-18 DIAGNOSIS — F419 Anxiety disorder, unspecified: Secondary | ICD-10-CM

## 2015-06-18 DIAGNOSIS — F329 Major depressive disorder, single episode, unspecified: Secondary | ICD-10-CM

## 2015-06-18 DIAGNOSIS — F32A Depression, unspecified: Secondary | ICD-10-CM

## 2015-06-18 DIAGNOSIS — E119 Type 2 diabetes mellitus without complications: Secondary | ICD-10-CM | POA: Diagnosis not present

## 2015-06-18 LAB — POCT GLYCOSYLATED HEMOGLOBIN (HGB A1C): HEMOGLOBIN A1C: 7.8

## 2015-06-18 LAB — GLUCOSE, POCT (MANUAL RESULT ENTRY): POC Glucose: 131 mg/dl — AB (ref 70–99)

## 2015-06-18 MED ORDER — FLUTICASONE PROPIONATE 50 MCG/ACT NA SUSP: 2.0000 | Freq: Every day | NASAL | Status: DC

## 2015-06-18 MED ORDER — METFORMIN HCL 1000 MG PO TABS
1000.0000 mg | ORAL_TABLET | Freq: Two times a day (BID) | ORAL | Status: DC
Start: 2015-06-18 — End: 2015-09-16

## 2015-06-18 MED ORDER — ROSUVASTATIN CALCIUM 20 MG PO TABS
20.0000 mg | ORAL_TABLET | Freq: Every day | ORAL | Status: DC
Start: 2015-06-18 — End: 2015-09-16

## 2015-06-18 MED ORDER — ALPRAZOLAM 0.5 MG PO TABS: 0.5000 mg | ORAL_TABLET | Freq: Every day | ORAL | Status: DC | PRN

## 2015-06-18 MED ORDER — CITALOPRAM HYDROBROMIDE 20 MG PO TABS: 20.0000 mg | ORAL_TABLET | Freq: Every day | ORAL | Status: DC

## 2015-06-18 NOTE — Progress Notes (Signed)
Name: Trevor Young   MRN: [id number]    DOB: 04-03-1961   Date:06/18/2015       Progress Note  Subjective  Chief Complaint  Chief Complaint  Patient presents with  . Follow-up    3 mo  . Hyperlipidemia  . Diabetes  . COPD  . Anxiety    Hyperlipidemia This is a chronic problem. The problem is controlled. Lipid results: Lipids not completed due to payment issues with [facility]. Pertinent negatives include no chest pain, leg pain or myalgias. Current antihyperlipidemic treatment includes statins.  Diabetes He presents for his follow-up diabetic visit. He has type 2 diabetes mellitus. His disease course has been stable. Pertinent negatives for hypoglycemia include no nervousness/anxiousness. Pertinent negatives for diabetes include no chest pain, no fatigue, no foot paresthesias, no polydipsia and no polyuria. Pertinent negatives for diabetic complications include no CVA or heart disease. Current diabetic treatment includes oral agent (monotherapy). His breakfast blood glucose range is generally 110-130 mg/dl.  Anxiety Presents for follow-up visit. The problem has been unchanged. Patient reports no chest pain, excessive worry, insomnia, irritability or nervous/anxious behavior.   Past treatments include benzodiazephines. The treatment provided significant relief. Compliance with prior treatments has been good.    Past Medical History  Diagnosis Date  . Hyperlipidemia   . Diabetes mellitus without complication (HCC)   . Anxiety   . Depression   . Hemorrhoid   . Colon polyp 2016  . Sleep apnea     Uses C-Pap machine   . COPD (chronic obstructive pulmonary disease) (HCC)     no inhalers  . Chronic kidney disease     kidney stone   . Arthritis     hands  . Anemia     h/o    Past Surgical History  Procedure Laterality Date  . Lipoma excision  1984  . Nasal fracture surgery  1988  . Colonoscopy  2016  . Umbilical hernia repair N/A 04/15/2015    Procedure: HERNIA REPAIR  UMBILICAL ADULT;  Surgeon: Kieth BrightlySeeplaputhur G Sankar, MD;  Location: [facility] ORS;  Service: General;  Laterality: N/A;    Family History  Problem Relation Age of Onset  . Hypertension Mother   . Diabetes Mother   . Hypertension Father   . Diabetes Brother   . Diabetes Maternal Grandmother     Social History   Social History  . Marital Status: Married    Spouse Name: N/A  . Number of Children: N/A  . Years of Education: N/A   Occupational History  . Not on file.   Social History Main Topics  . Smoking status: Current Every Day Smoker -- 1.00 packs/day for 45 years    Types: Cigarettes  . Smokeless tobacco: Current User  . Alcohol Use: 0.0 oz/week    0 Standard drinks or equivalent per week     Comment: occasionally  . Drug Use: No  . Sexual Activity: Not on file   Other Topics Concern  . Not on file   Social History Narrative     Current outpatient prescriptions:  .  ALPRAZolam (XANAX) 0.5 MG tablet, Take 1 tablet (0.5 mg total) by mouth daily as needed., Disp: 30 tablet, Rfl: 2 .  citalopram (CELEXA) 20 MG tablet, Take 1 tablet (20 mg total) by mouth daily. (Patient taking differently: Take 20 mg by mouth every morning. ), Disp: 90 tablet, Rfl: 0 .  fluticasone (FLONASE) 50 MCG/ACT nasal spray, Place 2 sprays into both nostrils daily., Disp: 16 g,  Rfl: 0 .  glucose blood test strip, 1 strip 2 (two) times daily. Use as directed to check BG BID, Disp: , Rfl:  .  ibuprofen (ADVIL,MOTRIN) 200 MG tablet, Take 200 mg by mouth every 6 (six) hours as needed., Disp: , Rfl:  .  metFORMIN (GLUCOPHAGE) 1000 MG tablet, Take 1 tablet (1,000 mg total) by mouth 2 (two) times daily with a meal., Disp: 180 tablet, Rfl: 0 .  ondansetron (ZOFRAN) 4 MG tablet, Take 1 tablet (4 mg total) by mouth every 8 (eight) hours as needed for nausea or vomiting., Disp: 20 tablet, Rfl: 0 .  rosuvastatin (CRESTOR) 20 MG tablet, Take 1 tablet (20 mg total) by mouth at bedtime., Disp: 90 tablet, Rfl:  0  Allergies  Allergen Reactions  . Codeine Nausea And Vomiting  . Sulfa Antibiotics      Review of Systems  Constitutional: Negative for irritability and fatigue.  Cardiovascular: Negative for chest pain.  Musculoskeletal: Negative for myalgias.  Endo/Heme/Allergies: Negative for polydipsia.  Psychiatric/Behavioral: The patient is not nervous/anxious and does not have insomnia.     Objective  Filed Vitals:   06/18/15 1546  BP: 128/74  Pulse: 83  Temp: 98.7 F (37.1 C)  TempSrc: Oral  Resp: 15  Height:  (1.727 m)  Weight: 205 lb 3.2 oz (93.078 kg)  SpO2: 95%    Physical Exam  Constitutional: He is oriented to person, place, and time and well-developed, well-nourished, and in no distress.  HENT:  Head: Normocephalic and atraumatic.  Cardiovascular: Normal rate, regular rhythm and normal heart sounds.   Pulmonary/Chest: Effort normal and breath sounds normal.  Neurological: He is alert and oriented to person, place, and time.  Psychiatric: Mood, memory, affect and judgment normal.  Nursing note and vitals reviewed.     Recent Results (from the past 2160 hour(s))  Glucose, capillary     Status: Abnormal   Collection Time: 04/15/15 12:43 PM  Result Value Ref Range   Glucose-Capillary 145 (H) 65 - 99 mg/dL  Glucose, capillary     Status: Abnormal   Collection Time: 04/15/15  3:00 PM  Result Value Ref Range   Glucose-Capillary 132 (H) 65 - 99 mg/dL    Assessment & Plan  1. Type 2 diabetes mellitus without complication, without long-term current use of insulin (HCC)  - metFORMIN (GLUCOPHAGE) 1000 MG tablet; Take 1 tablet (1,000 mg total) by mouth 2 (two) times daily with a meal.  Dispense: 180 tablet; Refill: 5 - POCT HgB A1C - POCT Glucose (CBG)  2. Anxiety  - ALPRAZolam (XANAX) 0.5 MG tablet; Take 1 tablet (0.5 mg total) by mouth daily as needed.  Dispense: 30 tablet; Refill: 2  3. Hyperlipidemia  - rosuvastatin (CRESTOR) 20 MG tablet; Take 1  tablet (20 mg total) by mouth at bedtime.  Dispense: 90 tablet; Refill: 5  4. Depression - citalopram (CELEXA) 20 MG tablet; Take 1 tablet (20 mg total) by mouth daily.  Dispense: 90 tablet; Refill: 1  5. Chronic sinusitis, unspecified location  - fluticasone (FLONASE) 50 MCG/ACT nasal spray; Place 2 sprays into both nostrils daily.  Dispense: 16 g; Refill: 2    Kairee Kozma Asad A. [facility] 06/18/2015 3:51 PM

## 2015-09-16 ENCOUNTER — Encounter: Payer: Self-pay | Admitting: Family Medicine

## 2015-09-16 ENCOUNTER — Ambulatory Visit (INDEPENDENT_AMBULATORY_CARE_PROVIDER_SITE_OTHER): Payer: BLUE CROSS/BLUE SHIELD | Admitting: Family Medicine

## 2015-09-16 VITALS — BP 126/70 | HR 88 | Temp 98.6°F | Resp 17 | Ht 68.0 in | Wt 206.2 lb

## 2015-09-16 DIAGNOSIS — F419 Anxiety disorder, unspecified: Secondary | ICD-10-CM | POA: Diagnosis not present

## 2015-09-16 DIAGNOSIS — F329 Major depressive disorder, single episode, unspecified: Secondary | ICD-10-CM | POA: Diagnosis not present

## 2015-09-16 DIAGNOSIS — E785 Hyperlipidemia, unspecified: Secondary | ICD-10-CM | POA: Diagnosis not present

## 2015-09-16 DIAGNOSIS — E119 Type 2 diabetes mellitus without complications: Secondary | ICD-10-CM | POA: Diagnosis not present

## 2015-09-16 DIAGNOSIS — F32A Depression, unspecified: Secondary | ICD-10-CM

## 2015-09-16 LAB — GLUCOSE, POCT (MANUAL RESULT ENTRY): POC GLUCOSE: 174 mg/dL — AB (ref 70–99)

## 2015-09-16 LAB — POCT GLYCOSYLATED HEMOGLOBIN (HGB A1C): HEMOGLOBIN A1C: 7.8

## 2015-09-16 MED ORDER — METFORMIN HCL 1000 MG PO TABS: 1000.0000 mg | ORAL_TABLET | Freq: Two times a day (BID) | ORAL | Status: DC

## 2015-09-16 MED ORDER — ROSUVASTATIN CALCIUM 20 MG PO TABS: 20.0000 mg | ORAL_TABLET | Freq: Every day | ORAL | Status: DC

## 2015-09-16 MED ORDER — ALPRAZOLAM 0.5 MG PO TABS: 0.5000 mg | ORAL_TABLET | Freq: Every day | ORAL | Status: DC | PRN

## 2015-09-16 MED ORDER — CITALOPRAM HYDROBROMIDE 20 MG PO TABS: 20.0000 mg | ORAL_TABLET | Freq: Every day | ORAL | Status: DC

## 2015-09-16 NOTE — Progress Notes (Signed)
Name: Trevor Young   MRN: [id number]    DOB: [birth year]   Date:09/16/2015       Progress Note  Subjective  Chief Complaint  Chief Complaint  Patient presents with  . Follow-up    3 mo  . Diabetes  . Hyperlipidemia  . COPD    Diabetes He presents for his follow-up diabetic visit. He has type 2 diabetes mellitus. His disease course has been stable. Pertinent negatives for hypoglycemia include no nervousness/anxiousness. Current diabetic treatment includes oral agent (monotherapy). He is compliant with treatment most of the time (Forgets to take the PM Metformin sometimes.). His breakfast blood glucose range is generally 90-110 mg/dl. An ACE inhibitor/angiotensin II receptor blocker is not being taken. He does not see a podiatrist.Eye exam is not current.  Hyperlipidemia This is a chronic problem. The problem is controlled. Recent lipid tests were reviewed and are normal. Pertinent negatives include no leg pain or myalgias. Current antihyperlipidemic treatment includes statins.  Depression      The patient presents with depression.  This is a chronic problem.  The onset quality is gradual.   Associated symptoms include no helplessness, no hopelessness, does not have insomnia, not irritable, no appetite change, no myalgias and not sad.  Past treatments include SSRIs - Selective serotonin reuptake inhibitors.  Past medical history includes anxiety and depression.   Anxiety Presents for follow-up visit. Patient reports no depressed mood, excessive worry, insomnia, nervous/anxious behavior or panic. The quality of sleep is good.   His past medical history is significant for depression. Past treatments include benzodiazephines. Compliance with prior treatments has been good.     Past Medical History  Diagnosis Date  . Hyperlipidemia   . Diabetes mellitus without complication (HCC)   . Anxiety   . Depression   . Hemorrhoid   . Colon polyp 2016  . Sleep apnea     Uses C-Pap machine   .  COPD (chronic obstructive pulmonary disease) (HCC)     no inhalers  . Chronic kidney disease     kidney stone   . Arthritis     hands  . Anemia     h/o    Past Surgical History  Procedure Laterality Date  . Lipoma excision  1984  . Nasal fracture surgery  1988  . Colonoscopy  2016  . Umbilical hernia repair N/A 04/15/2015    Procedure: HERNIA REPAIR UMBILICAL ADULT;  Surgeon: Kieth Brightly, MD;  Location: [facility] ORS;  Service: General;  Laterality: N/A;    Family History  Problem Relation Age of Onset  . Hypertension Mother   . Diabetes Mother   . Hypertension Father   . Diabetes Brother   . Diabetes Maternal Grandmother     Social History   Social History  . Marital Status: Married    Spouse Name: N/A  . Number of Children: N/A  . Years of Education: N/A   Occupational History  . Not on file.   Social History Main Topics  . Smoking status: Current Every Day Smoker -- 1.00 packs/day for 45 years    Types: Cigarettes  . Smokeless tobacco: Current User  . Alcohol Use: 0.0 oz/week    0 Standard drinks or equivalent per week     Comment: occasionally  . Drug Use: No  . Sexual Activity: Not on file   Other Topics Concern  . Not on file   Social History Narrative     Current outpatient prescriptions:  .  ALPRAZolam (  XANAX) 0.5 MG tablet, Take 1 tablet (0.5 mg total) by mouth daily as needed., Disp: 30 tablet, Rfl: 2 .  citalopram (CELEXA) 20 MG tablet, Take 1 tablet (20 mg total) by mouth daily., Disp: 90 tablet, Rfl: 1 .  fluticasone (FLONASE) 50 MCG/ACT nasal spray, Place 2 sprays into both nostrils daily., Disp: 16 g, Rfl: 2 .  glucose blood test strip, 1 strip 2 (two) times daily. Use as directed to check BG BID, Disp: , Rfl:  .  ibuprofen (ADVIL,MOTRIN) 200 MG tablet, Take 200 mg by mouth every 6 (six) hours as needed., Disp: , Rfl:  .  metFORMIN (GLUCOPHAGE) 1000 MG tablet, Take 1 tablet (1,000 mg total) by mouth 2 (two) times daily with a meal.,  Disp: 180 tablet, Rfl: 5 .  rosuvastatin (CRESTOR) 20 MG tablet, Take 1 tablet (20 mg total) by mouth at bedtime., Disp: 90 tablet, Rfl: 5  Allergies  Allergen Reactions  . Codeine Nausea And Vomiting  . Sulfa Antibiotics      Review of Systems  Constitutional: Negative for appetite change.  Musculoskeletal: Negative for myalgias.  Psychiatric/Behavioral: Positive for depression. The patient is not nervous/anxious and does not have insomnia.     Objective  Filed Vitals:   09/16/15 1527  BP: 126/70  Pulse: 88  Temp: 98.6 F (37 C)  TempSrc: Oral  Resp: 17  Height: 5\' 8"  (1.727 m)  Weight: 206 lb 3.2 oz (93.532 kg)  SpO2: 97%    Physical Exam  Constitutional: He is oriented to person, place, and time and well-developed, well-nourished, and in no distress. He is not irritable.  HENT:  Head: Normocephalic and atraumatic.  Cardiovascular: Normal rate, regular rhythm and normal heart sounds.   Pulmonary/Chest: Effort normal and breath sounds normal.  Abdominal: Soft. Bowel sounds are normal.  Neurological: He is alert and oriented to person, place, and time.  Psychiatric: Mood, memory, affect and judgment normal.  Nursing note and vitals reviewed.     Recent Results (from the past 2160 hour(s))  POCT HgB A1C     Status: None   Collection Time: 06/18/15  4:12 PM  Result Value Ref Range   Hemoglobin A1C 7.8      Assessment & Plan  1. Anxiety Stable on Xanax taken daily as needed. - ALPRAZolam (XANAX) 0.5 MG tablet; Take 1 tablet (0.5 mg total) by mouth daily as needed.  Dispense: 30 tablet; Refill: 2  2. Depression  - citalopram (CELEXA) 20 MG tablet; Take 1 tablet (20 mg total) by mouth daily.  Dispense: 90 tablet; Refill: 1  3. Hyperlipidemia  - rosuvastatin (CRESTOR) 20 MG tablet; Take 1 tablet (20 mg total) by mouth at bedtime.  Dispense: 90 tablet; Refill: 5  4. Type 2 diabetes mellitus without complication, without long-term current use of insulin  (HCC) A1c is above goal, patient is not taking the medication as directed, oftentimes forgetting to take the second dose at night. Continue on present therapy, advised to take metformin twice daily, and recheck in 3 months. - metFORMIN (GLUCOPHAGE) 1000 MG tablet; Take 1 tablet (1,000 mg total) by mouth 2 (two) times daily with a meal.  Dispense: 180 tablet; Refill: 5   Anesha Hackert Asad A. Faylene KurtzShah [facility] 09/16/2015 4:08 PM

## 2015-12-17 ENCOUNTER — Ambulatory Visit (INDEPENDENT_AMBULATORY_CARE_PROVIDER_SITE_OTHER): Payer: BLUE CROSS/BLUE SHIELD | Admitting: Family Medicine

## 2015-12-17 ENCOUNTER — Encounter: Payer: Self-pay | Admitting: Family Medicine

## 2015-12-17 VITALS — BP 123/68 | HR 68 | Temp 98.0°F | Resp 15 | Ht 68.0 in | Wt 199.3 lb

## 2015-12-17 DIAGNOSIS — F329 Major depressive disorder, single episode, unspecified: Secondary | ICD-10-CM

## 2015-12-17 DIAGNOSIS — E785 Hyperlipidemia, unspecified: Secondary | ICD-10-CM | POA: Diagnosis not present

## 2015-12-17 DIAGNOSIS — F419 Anxiety disorder, unspecified: Secondary | ICD-10-CM | POA: Diagnosis not present

## 2015-12-17 DIAGNOSIS — J329 Chronic sinusitis, unspecified: Secondary | ICD-10-CM | POA: Diagnosis not present

## 2015-12-17 DIAGNOSIS — E119 Type 2 diabetes mellitus without complications: Secondary | ICD-10-CM

## 2015-12-17 DIAGNOSIS — F32A Depression, unspecified: Secondary | ICD-10-CM

## 2015-12-17 LAB — GLUCOSE, POCT (MANUAL RESULT ENTRY): POC GLUCOSE: 126 mg/dL — AB (ref 70–99)

## 2015-12-17 LAB — POCT GLYCOSYLATED HEMOGLOBIN (HGB A1C): HEMOGLOBIN A1C: 7.3

## 2015-12-17 LAB — POCT UA - MICROALBUMIN
Albumin/Creatinine Ratio, Urine, POC: NEGATIVE
CREATININE, POC: NEGATIVE mg/dL
MICROALBUMIN (UR) POC: NEGATIVE mg/L

## 2015-12-17 MED ORDER — ROSUVASTATIN CALCIUM 20 MG PO TABS: 20.0000 mg | ORAL_TABLET | Freq: Every day | ORAL | Status: DC

## 2015-12-17 MED ORDER — METFORMIN HCL 1000 MG PO TABS: 1000.0000 mg | ORAL_TABLET | Freq: Two times a day (BID) | ORAL | Status: DC

## 2015-12-17 MED ORDER — FLUTICASONE PROPIONATE 50 MCG/ACT NA SUSP: 2.0000 | Freq: Every day | NASAL | Status: DC

## 2015-12-17 MED ORDER — ALPRAZOLAM 0.5 MG PO TABS: 0.5000 mg | ORAL_TABLET | Freq: Every day | ORAL | Status: DC | PRN

## 2015-12-17 MED ORDER — CITALOPRAM HYDROBROMIDE 20 MG PO TABS: 20.0000 mg | ORAL_TABLET | Freq: Every day | ORAL | Status: DC

## 2015-12-17 NOTE — Progress Notes (Signed)
Name: Trevor Young   MRN: [id number]    DOB: 03-19-1961   Date:12/17/2015       Progress Note  Subjective  Chief Complaint  Chief Complaint  Patient presents with  . Follow-up    3 mo  . Diabetes    Diabetes He presents for his follow-up diabetic visit. He has type 2 diabetes mellitus. His disease course has been improving. Hypoglycemia symptoms include nervousness/anxiousness. Pertinent negatives for diabetes include no chest pain, no fatigue, no polydipsia and no polyuria. There are no hypoglycemic complications. Pertinent negatives for diabetic complications include no CVA or heart disease. Current diabetic treatment includes oral agent (monotherapy). He is following a generally healthy diet. He monitors blood glucose at home 1-2 x per day.  Hyperlipidemia This is a chronic problem. Lipid results: Not able to obtain FLP since last year. Pertinent negatives include no chest pain or shortness of breath. Current antihyperlipidemic treatment includes statins. Compliance problems: has not taken medicatio for cholesterol in about a week.   Depression        This is a chronic problem.The problem is unchanged.  Associated symptoms include no fatigue and no hopelessness.  Past treatments include SSRIs - Selective serotonin reuptake inhibitors.  Past medical history includes anxiety.   Anxiety Presents for follow-up visit. The problem has been gradually worsening. Symptoms include excessive worry, irritability and nervous/anxious behavior. Patient reports no chest pain or shortness of breath. The severity of symptoms is moderate and causing significant distress. The symptoms are aggravated by work stress.   Past treatments include benzodiazephines. The treatment provided significant relief. Compliance with prior treatments has been good.      Past Medical History  Diagnosis Date  . Hyperlipidemia   . Diabetes mellitus without complication (HCC)   . Anxiety   . Depression   . Hemorrhoid   .  Colon polyp 2016  . Sleep apnea     Uses C-Pap machine   . COPD (chronic obstructive pulmonary disease) (HCC)     no inhalers  . Chronic kidney disease     kidney stone   . Arthritis     hands  . Anemia     h/o    Past Surgical History  Procedure Laterality Date  . Lipoma excision  1984  . Nasal fracture surgery  1988  . Colonoscopy  2016  . Umbilical hernia repair N/A 04/15/2015    Procedure: HERNIA REPAIR UMBILICAL ADULT;  Surgeon: Kieth BrightlySeeplaputhur G Sankar, MD;  Location: [facility] ORS;  Service: General;  Laterality: N/A;    Family History  Problem Relation Age of Onset  . Hypertension Mother   . Diabetes Mother   . Hypertension Father   . Diabetes Brother   . Diabetes Maternal Grandmother     Social History   Social History  . Marital Status: Married    Spouse Name: N/A  . Number of Children: N/A  . Years of Education: N/A   Occupational History  . Not on file.   Social History Main Topics  . Smoking status: Current Every Day Smoker -- 1.00 packs/day for 45 years    Types: Cigarettes  . Smokeless tobacco: Current User  . Alcohol Use: 0.0 oz/week    0 Standard drinks or equivalent per week     Comment: occasionally  . Drug Use: No  . Sexual Activity: Not on file   Other Topics Concern  . Not on file   Social History Narrative     Current outpatient prescriptions:  .  ALPRAZolam (XANAX) 0.5 MG tablet, Take 1 tablet (0.5 mg total) by mouth daily as needed., Disp: 30 tablet, Rfl: 2 .  citalopram (CELEXA) 20 MG tablet, Take 1 tablet (20 mg total) by mouth daily., Disp: 90 tablet, Rfl: 1 .  fluticasone (FLONASE) 50 MCG/ACT nasal spray, Place 2 sprays into both nostrils daily., Disp: 16 g, Rfl: 2 .  glucose blood test strip, 1 strip 2 (two) times daily. Use as directed to check BG BID, Disp: , Rfl:  .  ibuprofen (ADVIL,MOTRIN) 200 MG tablet, Take 200 mg by mouth every 6 (six) hours as needed., Disp: , Rfl:  .  metFORMIN (GLUCOPHAGE) 1000 MG tablet, Take 1 tablet  (1,000 mg total) by mouth 2 (two) times daily with a meal., Disp: 180 tablet, Rfl: 5 .  rosuvastatin (CRESTOR) 20 MG tablet, Take 1 tablet (20 mg total) by mouth at bedtime., Disp: 90 tablet, Rfl: 5  Allergies  Allergen Reactions  . Codeine Nausea And Vomiting  . Sulfa Antibiotics      Review of Systems  Constitutional: Positive for irritability. Negative for fatigue.  Respiratory: Negative for shortness of breath.   Cardiovascular: Negative for chest pain.  Endo/Heme/Allergies: Negative for polydipsia.  Psychiatric/Behavioral: Positive for depression. The patient is nervous/anxious.     Objective  Filed Vitals:   12/17/15 1125  BP: 123/68  Pulse: 68  Temp: 98 F (36.7 C)  TempSrc: Oral  Resp: 15  Height: 5\' 8"  (1.727 m)  Weight: 199 lb 4.8 oz (90.402 kg)  SpO2: 96%    Physical Exam  Constitutional: He is well-developed, well-nourished, and in no distress.  HENT:  Head: Normocephalic and atraumatic.  Nose: Right sinus exhibits no maxillary sinus tenderness and no frontal sinus tenderness. Left sinus exhibits no maxillary sinus tenderness and no frontal sinus tenderness.  Mouth/Throat: Posterior oropharyngeal erythema present.  Nasal mucosal inflammation, turbinates mildly hypertrophied.  Cardiovascular: Normal rate, regular rhythm and normal heart sounds.   No murmur heard. Pulmonary/Chest: Effort normal and breath sounds normal. He has no wheezes.  Nursing note and vitals reviewed.    Assessment & Plan  1. Hyperlipidemia Continue on statin therapy, recheck FLP - Lipid Profile - Comprehensive Metabolic Panel (CMET) - rosuvastatin (CRESTOR) 20 MG tablet; Take 1 tablet (20 mg total) by mouth at bedtime.  Dispense: 90 tablet; Refill: 1  2. Type 2 diabetes mellitus without complication, without long-term current use of insulin (HCC) A1c is above goal at 7.3%, continue on metformin. - POCT HgB A1C - POCT Glucose (CBG) - POCT UA - Microalbumin - metFORMIN  (GLUCOPHAGE) 1000 MG tablet; Take 1 tablet (1,000 mg total) by mouth 2 (two) times daily with a meal.  Dispense: 180 tablet; Refill: 1  3. Anxiety Continue on alprazolam as needed for anxiety - ALPRAZolam (XANAX) 0.5 MG tablet; Take 1 tablet (0.5 mg total) by mouth daily as needed.  Dispense: 30 tablet; Refill: 2  4. Depression  - citalopram (CELEXA) 20 MG tablet; Take 1 tablet (20 mg total) by mouth daily.  Dispense: 90 tablet; Refill: 1  5. Chronic sinusitis, unspecified location  - fluticasone (FLONASE) 50 MCG/ACT nasal spray; Place 2 sprays into both nostrils daily.  Dispense: 16 g; Refill: 2   Ericberto Padget Asad A. Faylene KurtzShah [facility] 12/17/2015 11:47 AM

## 2015-12-18 LAB — COMPREHENSIVE METABOLIC PANEL
ALK PHOS: 62 U/L (ref 40–115)
ALT: 22 U/L (ref 9–46)
AST: 13 U/L (ref 10–35)
Albumin: 4.8 g/dL (ref 3.6–5.1)
BUN: 10 mg/dL (ref 7–25)
CO2: 26 mmol/L (ref 20–31)
CREATININE: 0.94 mg/dL (ref 0.70–1.33)
Calcium: 10.2 mg/dL (ref 8.6–10.3)
Chloride: 101 mmol/L (ref 98–110)
Glucose, Bld: 120 mg/dL — ABNORMAL HIGH (ref 65–99)
POTASSIUM: 5.2 mmol/L (ref 3.5–5.3)
Sodium: 138 mmol/L (ref 135–146)
TOTAL PROTEIN: 8 g/dL (ref 6.1–8.1)
Total Bilirubin: 0.5 mg/dL (ref 0.2–1.2)

## 2015-12-18 LAB — LIPID PANEL
Cholesterol: 261 mg/dL — ABNORMAL HIGH (ref 125–200)
HDL: 34 mg/dL — ABNORMAL LOW (ref >=40)
LDL CALC: 160 mg/dL — AB (ref <130)
TRIGLYCERIDES: 335 mg/dL — AB (ref <150)
Total CHOL/HDL Ratio: 7.7 Ratio — ABNORMAL HIGH (ref <=5.0)
VLDL: 67 mg/dL — ABNORMAL HIGH (ref <30)

## 2016-02-18 ENCOUNTER — Other Ambulatory Visit: Payer: Self-pay | Admitting: Family Medicine

## 2016-02-18 DIAGNOSIS — F419 Anxiety disorder, unspecified: Secondary | ICD-10-CM

## 2016-02-28 ENCOUNTER — Other Ambulatory Visit: Payer: Self-pay | Admitting: Family Medicine

## 2016-02-28 DIAGNOSIS — J329 Chronic sinusitis, unspecified: Secondary | ICD-10-CM

## 2016-03-18 ENCOUNTER — Encounter: Payer: Self-pay | Admitting: Family Medicine

## 2016-03-18 ENCOUNTER — Ambulatory Visit (INDEPENDENT_AMBULATORY_CARE_PROVIDER_SITE_OTHER): Payer: BLUE CROSS/BLUE SHIELD | Admitting: Family Medicine

## 2016-03-18 DIAGNOSIS — E785 Hyperlipidemia, unspecified: Secondary | ICD-10-CM

## 2016-03-18 DIAGNOSIS — F329 Major depressive disorder, single episode, unspecified: Secondary | ICD-10-CM | POA: Diagnosis not present

## 2016-03-18 DIAGNOSIS — J329 Chronic sinusitis, unspecified: Secondary | ICD-10-CM | POA: Diagnosis not present

## 2016-03-18 DIAGNOSIS — E119 Type 2 diabetes mellitus without complications: Secondary | ICD-10-CM

## 2016-03-18 DIAGNOSIS — F419 Anxiety disorder, unspecified: Secondary | ICD-10-CM | POA: Diagnosis not present

## 2016-03-18 DIAGNOSIS — F32A Depression, unspecified: Secondary | ICD-10-CM

## 2016-03-18 LAB — GLUCOSE, POCT (MANUAL RESULT ENTRY): POC Glucose: 213 mg/dl — AB (ref 70–99)

## 2016-03-18 LAB — POCT GLYCOSYLATED HEMOGLOBIN (HGB A1C): HEMOGLOBIN A1C: 7.7

## 2016-03-18 MED ORDER — FLUTICASONE PROPIONATE 50 MCG/ACT NA SUSP: 2.0000 | Freq: Every day | NASAL | 2 refills | Status: DC

## 2016-03-18 MED ORDER — CITALOPRAM HYDROBROMIDE 20 MG PO TABS: 20.0000 mg | ORAL_TABLET | Freq: Every day | ORAL | 1 refills | Status: DC

## 2016-03-18 MED ORDER — ROSUVASTATIN CALCIUM 20 MG PO TABS: 20.0000 mg | ORAL_TABLET | Freq: Every day | ORAL | 1 refills | Status: DC

## 2016-03-18 MED ORDER — ALPRAZOLAM 0.5 MG PO TABS: 0.5000 mg | ORAL_TABLET | Freq: Every day | ORAL | 2 refills | Status: DC | PRN

## 2016-03-18 NOTE — Progress Notes (Signed)
Name: Trevor Young   MRN: [id number]    DOB: [birth year]   Date:03/18/2016       Progress Note  Subjective  Chief Complaint  Chief Complaint  Patient presents with  . Follow-up    3 mo  . Medication Refill    Diabetes  He presents for his follow-up diabetic visit. He has type 2 diabetes mellitus. His disease course has been worsening. Pertinent negatives for hypoglycemia include no nervousness/anxiousness. Associated symptoms include polydipsia. Pertinent negatives for diabetes include no chest pain, no fatigue, no polyuria and no weight loss. Current diabetic treatment includes oral agent (monotherapy). Compliance with diabetes treatment: He was taking Metformin 1000 mg only once daily, prescribed to be taken twice daily. He is following a diabetic diet. His breakfast blood glucose range is generally 110-130 mg/dl. An ACE inhibitor/angiotensin II receptor blocker is not being taken.  Hyperlipidemia  This is a chronic problem. The problem is uncontrolled. Recent lipid tests were reviewed and are high. Pertinent negatives include no chest pain, leg pain, myalgias or shortness of breath. Current antihyperlipidemic treatment includes statins.  Depression       (Has nervousness and anxiousness once in a while mainly associated with work stress)  This is a chronic problem.  Associated symptoms include no fatigue, no hopelessness, no restlessness, no myalgias and not sad.  Past treatments include SSRIs - Selective serotonin reuptake inhibitors.  Compliance with treatment is good.  Past medical history includes anxiety.   Anxiety  Presents for follow-up visit. Patient reports no chest pain, excessive worry, nervous/anxious behavior, panic, restlessness or shortness of breath. Primary symptoms comment: has nervousness and anxiousness once in a while mainly associated with work stress.      Past Medical History:  Diagnosis Date  . Anemia    h/o  . Anxiety   . Arthritis    hands  . Chronic  kidney disease    kidney stone   . Colon polyp 2016  . COPD (chronic obstructive pulmonary disease) (HCC)    no inhalers  . Depression   . Diabetes mellitus without complication (HCC)   . Hemorrhoid   . Hyperlipidemia   . Sleep apnea    Uses C-Pap machine     Past Surgical History:  Procedure Laterality Date  . COLONOSCOPY  2016  . LIPOMA EXCISION  1984  . NASAL FRACTURE SURGERY  1988  . UMBILICAL HERNIA REPAIR N/A 04/15/2015   Procedure: HERNIA REPAIR UMBILICAL ADULT;  Surgeon: Kieth Brightly, MD;  Location: [facility] ORS;  Service: General;  Laterality: N/A;    Family History  Problem Relation Age of Onset  . Hypertension Mother   . Diabetes Mother   . Hypertension Father   . Diabetes Brother   . Diabetes Maternal Grandmother     Social History   Social History  . Marital status: Married    Spouse name: N/A  . Number of children: N/A  . Years of education: N/A   Occupational History  . Not on file.   Social History Main Topics  . Smoking status: Current Every Day Smoker    Packs/day: 1.00    Years: 45.00    Types: Cigarettes  . Smokeless tobacco: Current User  . Alcohol use 0.0 oz/week     Comment: occasionally  . Drug use: No  . Sexual activity: Not on file   Other Topics Concern  . Not on file   Social History Narrative  . No narrative on file  Current Outpatient Prescriptions:  .  ALPRAZolam (XANAX) 0.5 MG tablet, Take 1 tablet (0.5 mg total) by mouth daily as needed., Disp: 30 tablet, Rfl: 2 .  citalopram (CELEXA) 20 MG tablet, Take 1 tablet (20 mg total) by mouth daily., Disp: 90 tablet, Rfl: 1 .  fluticasone (FLONASE) 50 MCG/ACT nasal spray, INHALE 2 SPRAYS IN BOTH NOSTRILS ONCE DAILY, Disp: 16 g, Rfl: 2 .  glucose blood test strip, 1 strip 2 (two) times daily. Use as directed to check BG BID, Disp: , Rfl:  .  ibuprofen (ADVIL,MOTRIN) 200 MG tablet, Take 200 mg by mouth every 6 (six) hours as needed., Disp: , Rfl:  .  metFORMIN  (GLUCOPHAGE) 1000 MG tablet, Take 1 tablet (1,000 mg total) by mouth 2 (two) times daily with a meal., Disp: 180 tablet, Rfl: 1 .  rosuvastatin (CRESTOR) 20 MG tablet, Take 1 tablet (20 mg total) by mouth at bedtime., Disp: 90 tablet, Rfl: 1  Allergies  Allergen Reactions  . Codeine Nausea And Vomiting  . Sulfa Antibiotics      Review of Systems  Constitutional: Negative for chills, fatigue, fever, malaise/fatigue and weight loss.  Respiratory: Negative for shortness of breath.   Cardiovascular: Negative for chest pain.  Gastrointestinal: Negative for abdominal pain.  Musculoskeletal: Negative for myalgias.  Endo/Heme/Allergies: Positive for polydipsia.  Psychiatric/Behavioral: Positive for depression. The patient is not nervous/anxious.     Objective  Vitals:   03/18/16 1555  BP: 121/66  Pulse: 79  Resp: 15  Temp: 99.1 F (37.3 C)  TempSrc: Oral  SpO2: 96%  Weight: 206 lb 6.4 oz (93.6 kg)  Height: 5\' 8"  (1.727 m)    Physical Exam  Constitutional: He is oriented to person, place, and time and well-developed, well-nourished, and in no distress.  HENT:  Head: Normocephalic and atraumatic.  Cardiovascular: Normal rate, regular rhythm and normal heart sounds.   No murmur heard. Pulmonary/Chest: Effort normal and breath sounds normal. He has no wheezes.  Abdominal: Soft. Bowel sounds are normal.  Musculoskeletal:       Right ankle: He exhibits no swelling.       Left ankle: He exhibits no swelling.  Neurological: He is alert and oriented to person, place, and time.  Psychiatric: Mood, memory, affect and judgment normal.  Nursing note and vitals reviewed.     Assessment & Plan  1. Anxiety Stable and responsive to alprazolam taken daily as needed - ALPRAZolam (XANAX) 0.5 MG tablet; Take 1 tablet (0.5 mg total) by mouth daily as needed.  Dispense: 30 tablet; Refill: 2  2. Chronic sinusitis, unspecified location  - fluticasone (FLONASE) 50 MCG/ACT nasal spray;  Place 2 sprays into both nostrils daily.  Dispense: 16 g; Refill: 2  3. Depression  - citalopram (CELEXA) 20 MG tablet; Take 1 tablet (20 mg total) by mouth daily.  Dispense: 90 tablet; Refill: 1  4. Hyperlipidemia Abnormal FLP with elevated total cholesterol, triglycerides and LDL. Recheck and consider adjustment of statin therapy - rosuvastatin (CRESTOR) 20 MG tablet; Take 1 tablet (20 mg total) by mouth at bedtime.  Dispense: 90 tablet; Refill: 1 - Lipid Profile - COMPLETE METABOLIC PANEL WITH GFR  5. Diabetes mellitus without complication (HCC) A1c is 7.7%, considered above goal. Patient reports that he is only been taking metformin once daily as opposed to twice daily. Advised to take metformin 1000 mg twice a day as prescribed. Recheck in 3 months - POCT HgB A1C - POCT Glucose (CBG)   Veyda Kaufman Asad A.  [facility] 03/18/2016 4:10 PM

## 2016-03-31 LAB — COMPLETE METABOLIC PANEL WITH GFR
ALT: 20 U/L (ref 9–46)
AST: 12 U/L (ref 10–35)
Albumin: 4.1 g/dL (ref 3.6–5.1)
Alkaline Phosphatase: 73 U/L (ref 40–115)
BUN: 12 mg/dL (ref 7–25)
CHLORIDE: 104 mmol/L (ref 98–110)
CO2: 24 mmol/L (ref 20–31)
CREATININE: 0.85 mg/dL (ref 0.70–1.33)
Calcium: 9.1 mg/dL (ref 8.6–10.3)
GFR, Est Non African American: >89 mL/min (ref >=60)
Glucose, Bld: 235 mg/dL — ABNORMAL HIGH (ref 65–99)
POTASSIUM: 4.8 mmol/L (ref 3.5–5.3)
Sodium: 136 mmol/L (ref 135–146)
Total Bilirubin: 0.4 mg/dL (ref 0.2–1.2)
Total Protein: 6.8 g/dL (ref 6.1–8.1)

## 2016-03-31 LAB — LIPID PANEL
CHOL/HDL RATIO: 4.3 ratio (ref <=5.0)
Cholesterol: 120 mg/dL — ABNORMAL LOW (ref 125–200)
HDL: 28 mg/dL — ABNORMAL LOW (ref >=40)
LDL CALC: 61 mg/dL (ref <130)
TRIGLYCERIDES: 153 mg/dL — AB (ref <150)
VLDL: 31 mg/dL — AB (ref <30)

## 2016-05-15 ENCOUNTER — Other Ambulatory Visit: Payer: Self-pay | Admitting: Family Medicine

## 2016-05-15 DIAGNOSIS — F419 Anxiety disorder, unspecified: Secondary | ICD-10-CM

## 2016-05-28 ENCOUNTER — Ambulatory Visit (INDEPENDENT_AMBULATORY_CARE_PROVIDER_SITE_OTHER): Payer: BLUE CROSS/BLUE SHIELD | Admitting: Family Medicine

## 2016-05-28 ENCOUNTER — Encounter: Payer: Self-pay | Admitting: Family Medicine

## 2016-05-28 DIAGNOSIS — E119 Type 2 diabetes mellitus without complications: Secondary | ICD-10-CM

## 2016-05-28 DIAGNOSIS — F419 Anxiety disorder, unspecified: Secondary | ICD-10-CM | POA: Diagnosis not present

## 2016-05-28 DIAGNOSIS — F3342 Major depressive disorder, recurrent, in full remission: Secondary | ICD-10-CM

## 2016-05-28 DIAGNOSIS — J329 Chronic sinusitis, unspecified: Secondary | ICD-10-CM

## 2016-05-28 DIAGNOSIS — E785 Hyperlipidemia, unspecified: Secondary | ICD-10-CM | POA: Diagnosis not present

## 2016-05-28 MED ORDER — CITALOPRAM HYDROBROMIDE 20 MG PO TABS: 20.0000 mg | ORAL_TABLET | Freq: Every day | ORAL | 1 refills | Status: DC

## 2016-05-28 MED ORDER — METFORMIN HCL 1000 MG PO TABS: 1000.0000 mg | ORAL_TABLET | Freq: Two times a day (BID) | ORAL | 1 refills | Status: DC

## 2016-05-28 MED ORDER — ALPRAZOLAM 0.5 MG PO TABS: 0.5000 mg | ORAL_TABLET | Freq: Every day | ORAL | 2 refills | Status: DC | PRN

## 2016-05-28 MED ORDER — ROSUVASTATIN CALCIUM 20 MG PO TABS: 20.0000 mg | ORAL_TABLET | Freq: Every day | ORAL | 1 refills | Status: DC

## 2016-05-28 MED ORDER — FLUTICASONE PROPIONATE 50 MCG/ACT NA SUSP: 2.0000 | Freq: Every day | NASAL | 2 refills | Status: DC

## 2016-05-28 NOTE — Progress Notes (Signed)
Name: Trevor Young   MRN: [id number]    DOB: [birth year]   Date:05/28/2016       Progress Note  Subjective  Chief Complaint  Chief Complaint  Patient presents with  . Follow-up    3 mo  . Medication Refill    Diabetes  He presents for his follow-up diabetic visit. He has type 2 diabetes mellitus. His disease course has been improving. Pertinent negatives for hypoglycemia include no nervousness/anxiousness. Pertinent negatives for diabetes include no chest pain, no fatigue, no polydipsia and no polyuria. There are no hypoglycemic complications. Pertinent negatives for diabetic complications include no CVA or heart disease. Current diabetic treatment includes oral agent (monotherapy). He is compliant with treatment most of the time (sometimes forgets tot ake the second pill of Metformin in the evening). He is following a generally healthy diet. He monitors blood glucose at home 1-2 x per day. His breakfast blood glucose range is generally 110-130 mg/dl.  Hyperlipidemia  This is a chronic problem. The problem is controlled. Recent lipid tests were reviewed and are normal. Pertinent negatives include no chest pain or shortness of breath. Current antihyperlipidemic treatment includes statins. Compliance problems: has not taken medicatio for cholesterol in about a week.   Depression         This is a chronic problem.The problem is unchanged.  Associated symptoms include no fatigue and no hopelessness.  Past treatments include SSRIs - Selective serotonin reuptake inhibitors.  Past medical history includes anxiety.   Anxiety  Presents for follow-up visit. The problem has been gradually worsening. Symptoms include excessive worry and irritability. Patient reports no chest pain, nervous/anxious behavior or shortness of breath. The severity of symptoms is moderate and causing significant distress. The symptoms are aggravated by work stress.   Past treatments include benzodiazephines. The treatment provided  significant relief. Compliance with prior treatments has been good.     Past Medical History:  Diagnosis Date  . Anemia    h/o  . Anxiety   . Arthritis    hands  . Chronic kidney disease    kidney stone   . Colon polyp 2016  . COPD (chronic obstructive pulmonary disease) (HCC)    no inhalers  . Depression   . Diabetes mellitus without complication (HCC)   . Hemorrhoid   . Hyperlipidemia   . Sleep apnea    Uses C-Pap machine     Past Surgical History:  Procedure Laterality Date  . COLONOSCOPY  2016  . LIPOMA EXCISION  1984  . NASAL FRACTURE SURGERY  1988  . UMBILICAL HERNIA REPAIR N/A 04/15/2015   Procedure: HERNIA REPAIR UMBILICAL ADULT;  Surgeon: Kieth Brightly, MD;  Location: [facility] ORS;  Service: General;  Laterality: N/A;    Family History  Problem Relation Age of Onset  . Hypertension Mother   . Diabetes Mother   . Hypertension Father   . Diabetes Brother   . Diabetes Maternal Grandmother     Social History   Social History  . Marital status: Married    Spouse name: N/A  . Number of children: N/A  . Years of education: N/A   Occupational History  . Not on file.   Social History Main Topics  . Smoking status: Current Every Day Smoker    Packs/day: 1.00    Years: 45.00    Types: Cigarettes  . Smokeless tobacco: Current User  . Alcohol use 0.0 oz/week     Comment: occasionally  . Drug use: No  .  Sexual activity: Not on file   Other Topics Concern  . Not on file   Social History Narrative  . No narrative on file     Current Outpatient Prescriptions:  .  ALPRAZolam (XANAX) 0.5 MG tablet, Take 1 tablet (0.5 mg total) by mouth daily as needed., Disp: 30 tablet, Rfl: 2 .  citalopram (CELEXA) 20 MG tablet, Take 1 tablet (20 mg total) by mouth daily., Disp: 90 tablet, Rfl: 1 .  fluticasone (FLONASE) 50 MCG/ACT nasal spray, Place 2 sprays into both nostrils daily., Disp: 16 g, Rfl: 2 .  glucose blood test strip, 1 strip 2 (two) times daily.  Use as directed to check BG BID, Disp: , Rfl:  .  ibuprofen (ADVIL,MOTRIN) 200 MG tablet, Take 200 mg by mouth every 6 (six) hours as needed., Disp: , Rfl:  .  metFORMIN (GLUCOPHAGE) 1000 MG tablet, Take 1 tablet (1,000 mg total) by mouth 2 (two) times daily with a meal., Disp: 180 tablet, Rfl: 1 .  rosuvastatin (CRESTOR) 20 MG tablet, Take 1 tablet (20 mg total) by mouth at bedtime., Disp: 90 tablet, Rfl: 1  Allergies  Allergen Reactions  . Codeine Nausea And Vomiting  . Sulfa Antibiotics      Review of Systems  Constitutional: Positive for irritability. Negative for fatigue.  Respiratory: Negative for shortness of breath.   Cardiovascular: Negative for chest pain.  Endo/Heme/Allergies: Negative for polydipsia.  Psychiatric/Behavioral: Positive for depression. The patient is not nervous/anxious.      Objective  Vitals:   05/28/16 1506  BP: 118/67  Pulse: 83  Resp: 15  Temp: 98.3 F (36.8 C)  TempSrc: Oral  SpO2: 95%  Weight: 210 lb 9.6 oz (95.5 kg)  Height: 5\' 8"  (1.727 m)    Physical Exam  Constitutional: He is oriented to person, place, and time and well-developed, well-nourished, and in no distress.  HENT:  Head: Normocephalic and atraumatic.  Eyes: Conjunctivae are normal. Pupils are equal, round, and reactive to light.  Cardiovascular: Normal rate, regular rhythm and normal heart sounds.   No murmur heard. Pulmonary/Chest: Effort normal and breath sounds normal. No respiratory distress. He has no wheezes.  Abdominal: Soft. Bowel sounds are normal. There is no tenderness.  Musculoskeletal: Normal range of motion.  Neurological: He is alert and oriented to person, place, and time.  Psychiatric: Mood, memory, affect and judgment normal.  Nursing note and vitals reviewed.     Assessment & Plan  1. Anxiety Stable, responsive to alprazolam taken daily as needed. Refills provided - ALPRAZolam (XANAX) 0.5 MG tablet; Take 1 tablet (0.5 mg total) by mouth daily as  needed.  Dispense: 30 tablet; Refill: 2  2. Chronic sinusitis, unspecified location  - fluticasone (FLONASE) 50 MCG/ACT nasal spray; Place 2 sprays into both nostrils daily.  Dispense: 16 g; Refill: 2  3. Recurrent major depressive disorder, in full remission (HCC)  - citalopram (CELEXA) 20 MG tablet; Take 1 tablet (20 mg total) by mouth daily.  Dispense: 90 tablet; Refill: 1  4. Hyperlipidemia, unspecified hyperlipidemia type  - rosuvastatin (CRESTOR) 20 MG tablet; Take 1 tablet (20 mg total) by mouth at bedtime.  Dispense: 90 tablet; Refill: 1  5. Type 2 diabetes mellitus without complication, without long-term current use of insulin (HCC) Continue on metformin, recheck A1c in 1 month - metFORMIN (GLUCOPHAGE) 1000 MG tablet; Take 1 tablet (1,000 mg total) by mouth 2 (two) times daily with a meal.  Dispense: 180 tablet; Refill: 1   Toys ''R'' UsSyed Asad  Theodis ShoveA. Ivery Michalski [facility] 05/28/2016 4:18 PM

## 2016-08-13 ENCOUNTER — Other Ambulatory Visit: Payer: Self-pay | Admitting: Family Medicine

## 2016-08-13 DIAGNOSIS — F419 Anxiety disorder, unspecified: Secondary | ICD-10-CM

## 2016-08-26 ENCOUNTER — Ambulatory Visit (INDEPENDENT_AMBULATORY_CARE_PROVIDER_SITE_OTHER): Payer: BLUE CROSS/BLUE SHIELD | Admitting: Family Medicine

## 2016-08-26 ENCOUNTER — Encounter: Payer: Self-pay | Admitting: Family Medicine

## 2016-08-26 VITALS — BP 118/78 | HR 73 | Temp 98.3°F | Resp 16 | Ht 68.0 in | Wt 203.1 lb

## 2016-08-26 DIAGNOSIS — E1165 Type 2 diabetes mellitus with hyperglycemia: Secondary | ICD-10-CM

## 2016-08-26 DIAGNOSIS — E782 Mixed hyperlipidemia: Secondary | ICD-10-CM | POA: Diagnosis not present

## 2016-08-26 DIAGNOSIS — IMO0001 Reserved for inherently not codable concepts without codable children: Secondary | ICD-10-CM

## 2016-08-26 DIAGNOSIS — F419 Anxiety disorder, unspecified: Secondary | ICD-10-CM | POA: Diagnosis not present

## 2016-08-26 LAB — POCT GLYCOSYLATED HEMOGLOBIN (HGB A1C): Hemoglobin A1C: 8.4

## 2016-08-26 LAB — GLUCOSE, POCT (MANUAL RESULT ENTRY): POC Glucose: 97 mg/dl (ref 70–99)

## 2016-08-26 MED ORDER — ALPRAZOLAM 0.5 MG PO TABS: 0.5000 mg | ORAL_TABLET | Freq: Every day | ORAL | 2 refills | Status: DC | PRN

## 2016-08-26 MED ORDER — SITAGLIPTIN PHOSPHATE 100 MG PO TABS: 100.0000 mg | ORAL_TABLET | Freq: Every day | ORAL | 0 refills | Status: DC

## 2016-08-26 NOTE — Progress Notes (Signed)
Name: Trevor Young   MRN: [id number]    DOB: 21-Jul-1960   Date:08/26/2016       Progress Note  Subjective  Chief Complaint  Chief Complaint  Patient presents with  . Follow-up    3 mo  . Medication Refill    Diabetes  He presents for his follow-up diabetic visit. He has type 2 diabetes mellitus. His disease course has been worsening. Hypoglycemia symptoms include nervousness/anxiousness. Associated symptoms include polydipsia and polyuria. Pertinent negatives for diabetes include no chest pain and no foot paresthesias. There are no hypoglycemic complications. Pertinent negatives for diabetic complications include no CVA or heart disease. Current diabetic treatment includes oral agent (monotherapy). He is compliant with treatment most of the time (sometimes forgets tot ake the second pill of Metformin in the evening). He is following a generally healthy and diabetic diet. He monitors blood glucose at home 1-2 x per day. His breakfast blood glucose range is generally 180-200 mg/dl. His dinner blood glucose range is generally 110-130 mg/dl. An ACE inhibitor/angiotensin II receptor blocker is not being taken. Eye exam is current.  Hyperlipidemia  This is a chronic problem. The problem is uncontrolled (elevated triglycerides). Recent lipid tests were reviewed and are normal. Pertinent negatives include no chest pain or shortness of breath. Current antihyperlipidemic treatment includes statins.  Anxiety  Presents for follow-up visit. The problem has been gradually worsening. Symptoms include excessive worry, irritability and nervous/anxious behavior. Patient reports no chest pain, depressed mood or shortness of breath. The severity of symptoms is moderate and causing significant distress. The symptoms are aggravated by work stress.   Past treatments include benzodiazephines. The treatment provided significant relief. Compliance with prior treatments has been good.     Past Medical History:   Diagnosis Date  . Anemia    h/o  . Anxiety   . Arthritis    hands  . Chronic kidney disease    kidney stone   . Colon polyp 2016  . COPD (chronic obstructive pulmonary disease) (HCC)    no inhalers  . Depression   . Diabetes mellitus without complication (HCC)   . Hemorrhoid   . Hyperlipidemia   . Sleep apnea    Uses C-Pap machine     Past Surgical History:  Procedure Laterality Date  . COLONOSCOPY  2016  . LIPOMA EXCISION  1984  . NASAL FRACTURE SURGERY  1988  . UMBILICAL HERNIA REPAIR N/A 04/15/2015   Procedure: HERNIA REPAIR UMBILICAL ADULT;  Surgeon: Kieth Brightly, MD;  Location: [facility] ORS;  Service: General;  Laterality: N/A;    Family History  Problem Relation Age of Onset  . Hypertension Mother   . Diabetes Mother   . Hypertension Father   . Diabetes Brother   . Diabetes Maternal Grandmother     Social History   Social History  . Marital status: Married    Spouse name: N/A  . Number of children: N/A  . Years of education: N/A   Occupational History  . Not on file.   Social History Main Topics  . Smoking status: Current Every Day Smoker    Packs/day: 1.00    Years: 45.00    Types: Cigarettes  . Smokeless tobacco: Current User  . Alcohol use 0.0 oz/week     Comment: occasionally  . Drug use: No  . Sexual activity: Not on file   Other Topics Concern  . Not on file   Social History Narrative  . No narrative on file  Current Outpatient Prescriptions:  .  ALPRAZolam (XANAX) 0.5 MG tablet, Take 1 tablet (0.5 mg total) by mouth daily as needed., Disp: 30 tablet, Rfl: 2 .  citalopram (CELEXA) 20 MG tablet, Take 1 tablet (20 mg total) by mouth daily., Disp: 90 tablet, Rfl: 1 .  fluticasone (FLONASE) 50 MCG/ACT nasal spray, Place 2 sprays into both nostrils daily., Disp: 16 g, Rfl: 2 .  glucose blood test strip, 1 strip 2 (two) times daily. Use as directed to check BG BID, Disp: , Rfl:  .  ibuprofen (ADVIL,MOTRIN) 200 MG tablet, Take 200  mg by mouth every 6 (six) hours as needed., Disp: , Rfl:  .  metFORMIN (GLUCOPHAGE) 1000 MG tablet, Take 1 tablet (1,000 mg total) by mouth 2 (two) times daily with a meal., Disp: 180 tablet, Rfl: 1 .  rosuvastatin (CRESTOR) 20 MG tablet, Take 1 tablet (20 mg total) by mouth at bedtime., Disp: 90 tablet, Rfl: 1  Allergies  Allergen Reactions  . Codeine Nausea And Vomiting  . Sulfa Antibiotics      Review of Systems  Constitutional: Positive for irritability.  Respiratory: Negative for shortness of breath.   Cardiovascular: Negative for chest pain.  Endo/Heme/Allergies: Positive for polydipsia.  Psychiatric/Behavioral: The patient is nervous/anxious.      Objective  Vitals:   08/26/16 1428  BP: 118/78  Pulse: 73  Resp: 16  Temp: 98.3 F (36.8 C)  TempSrc: Oral  SpO2: 96%  Weight: 203 lb 1.6 oz (92.1 kg)  Height: 5\' 8"  (1.727 m)    Physical Exam  Constitutional: He is oriented to person, place, and time and well-developed, well-nourished, and in no distress.  HENT:  Head: Normocephalic and atraumatic.  Eyes: Conjunctivae are normal. Pupils are equal, round, and reactive to light.  Cardiovascular: Normal rate, regular rhythm and normal heart sounds.   No murmur heard. Pulmonary/Chest: Effort normal and breath sounds normal. No respiratory distress. He has no wheezes.  Abdominal: Soft. Bowel sounds are normal. There is no tenderness.  Musculoskeletal: Normal range of motion.  Neurological: He is alert and oriented to person, place, and time.  Psychiatric: Mood, memory, affect and judgment normal.  Nursing note and vitals reviewed.   Recent Results (from the past 2160 hour(s))  POCT HgB A1C     Status: Abnormal   Collection Time: 08/26/16  2:43 PM  Result Value Ref Range   Hemoglobin A1C 8.4   POCT Glucose (CBG)     Status: Normal   Collection Time: 08/26/16  2:43 PM  Result Value Ref Range   POC Glucose 97 70 - 99 mg/dl     Assessment & Plan  1.  Uncontrolled type 2 diabetes mellitus without complication, without long-term current use of insulin (HCC)   A1c is 8.4%, poorly controlled diabetes, start on Januvia 100 mg daily, advised to continue with dietary modification, and encouraged to become more physically active. Reassess in 3 months - POCT HgB A1C - POCT Glucose (CBG) - sitaGLIPtin (JANUVIA) 100 MG tablet; Take 1 tablet (100 mg total) by mouth daily.  Dispense: 90 tablet; Refill: 0  2. Anxiety Recently worse because of family issues and worried about losing his job, continue alprazolam as prescribed. - ALPRAZolam (XANAX) 0.5 MG tablet; Take 1 tablet (0.5 mg total) by mouth daily as needed.  Dispense: 30 tablet; Refill: 2  3. Mixed hyperlipidemia Obtain FLP, continue on statin - Lipid panel - COMPLETE METABOLIC PANEL WITH GFR   Christa Fasig Asad A. [facility]  [facility] 08/26/2016 2:44 PM

## 2016-09-07 ENCOUNTER — Emergency Department: Payer: BLUE CROSS/BLUE SHIELD

## 2016-09-07 DIAGNOSIS — Z7984 Long term (current) use of oral hypoglycemic drugs: Secondary | ICD-10-CM | POA: Insufficient documentation

## 2016-09-07 DIAGNOSIS — Z79899 Other long term (current) drug therapy: Secondary | ICD-10-CM | POA: Insufficient documentation

## 2016-09-07 DIAGNOSIS — J449 Chronic obstructive pulmonary disease, unspecified: Secondary | ICD-10-CM | POA: Insufficient documentation

## 2016-09-07 DIAGNOSIS — E1122 Type 2 diabetes mellitus with diabetic chronic kidney disease: Secondary | ICD-10-CM | POA: Insufficient documentation

## 2016-09-07 DIAGNOSIS — R079 Chest pain, unspecified: Secondary | ICD-10-CM | POA: Insufficient documentation

## 2016-09-07 DIAGNOSIS — N189 Chronic kidney disease, unspecified: Secondary | ICD-10-CM | POA: Insufficient documentation

## 2016-09-07 DIAGNOSIS — F17210 Nicotine dependence, cigarettes, uncomplicated: Secondary | ICD-10-CM | POA: Insufficient documentation

## 2016-09-07 LAB — CBC
HEMATOCRIT: 44.2 % (ref 40.0–52.0)
Hemoglobin: 15.2 g/dL (ref 13.0–18.0)
MCH: 30.2 pg (ref 26.0–34.0)
MCHC: 34.4 g/dL (ref 32.0–36.0)
MCV: 88 fL (ref 80.0–100.0)
Platelets: 163 10*3/uL (ref 150–440)
RBC: 5.02 MIL/uL (ref 4.40–5.90)
RDW: 12.8 % (ref 11.5–14.5)
WBC: 11.6 10*3/uL — AB (ref 3.8–10.6)

## 2016-09-07 LAB — COMPREHENSIVE METABOLIC PANEL
ALT: 30 U/L (ref 17–63)
ANION GAP: 7 (ref 5–15)
AST: 26 U/L (ref 15–41)
Albumin: 4.7 g/dL (ref 3.5–5.0)
Alkaline Phosphatase: 58 U/L (ref 38–126)
BUN: 14 mg/dL (ref 6–20)
CHLORIDE: 101 mmol/L (ref 101–111)
CO2: 26 mmol/L (ref 22–32)
CREATININE: 1.08 mg/dL (ref 0.61–1.24)
Calcium: 9.6 mg/dL (ref 8.9–10.3)
Glucose, Bld: 105 mg/dL — ABNORMAL HIGH (ref 65–99)
POTASSIUM: 3.8 mmol/L (ref 3.5–5.1)
SODIUM: 134 mmol/L — AB (ref 135–145)
Total Bilirubin: 1 mg/dL (ref 0.3–1.2)
Total Protein: 7.9 g/dL (ref 6.5–8.1)

## 2016-09-07 LAB — TROPONIN I

## 2016-09-07 NOTE — ED Triage Notes (Signed)
Pt in with co chest pain that started yesterday, no hx of heart disease. Did have episode of dizziness last week, no recent illness.

## 2016-09-08 ENCOUNTER — Emergency Department
Admission: EM | Admit: 2016-09-08 | Discharge: 2016-09-08 | Disposition: A | Discharge status: Home / Self Care | Payer: BLUE CROSS/BLUE SHIELD | Attending: Emergency Medicine | Admitting: Emergency Medicine

## 2016-09-08 DIAGNOSIS — R079 Chest pain, unspecified: Secondary | ICD-10-CM

## 2016-09-08 LAB — TROPONIN I: Troponin I: <0.03 ng/mL (ref <0.03)

## 2016-09-08 NOTE — ED Provider Notes (Signed)
[facility] Emergency Department Provider Note   ____________________________________________   First MD Initiated Contact with Patient 09/08/16 0036     (approximate)  I have reviewed the triage vital signs and the nursing notes.   HISTORY  Chief Complaint Chest Pain    HPI Trevor Young is a 56 y.o. male who comes into the hospital today with some chest pain. The patient reports that this evening around 7 PM he had some sharp pains in his chest that went from the left side of his chest over to the right side of his chest. The patient reports that he was just sitting down watching television when occurred. He had similar episode yesterday and called the paramedics out to his home. They did an EKG and said that everything looked fine. The patient at that time opted not to come in to the hospital and they told him that should it happen again he should call them back or should come and get checked out. The patient reports that the episode lasted about 10-15 minutes and then went away. His pain is currently a 0 out of 10 in intensity. The patient denies any shortness of breath, sweats, dizziness. A couple of weeks ago he did have dizziness when he was helping his son and the junkyard. The patient is also not had any nausea or vomiting. The patient denies having anything like this in the past. He reports that he recently started a new diabetes medication and he is concerned that the side effects may be on his heart. He said that after he had the pain yesterday he stopped taking the medicine. The patient is here today for evaluation.   Past Medical History:  Diagnosis Date  . Anemia    h/o  . Anxiety   . Arthritis    hands  . Chronic kidney disease    kidney stone   . Colon polyp 2016  . COPD (chronic obstructive pulmonary disease) (HCC)    no inhalers  . Depression   . Diabetes mellitus without complication (HCC)   . Hemorrhoid   . Hyperlipidemia   . Sleep  apnea    Uses C-Pap machine     Patient Active Problem List   Diagnosis Date Noted  . Diabetes mellitus without complication (HCC) 03/18/2016  . Sinusitis, chronic 06/18/2015  . Umbilical hernia without obstruction or gangrene 03/19/2015  . Hyperlipidemia 11/22/2014  . Depression 11/22/2014  . Anxiety 09/22/2014  . Routine general medical examination at a health care facility 09/22/2014  . Diabetes mellitus, type 2 (HCC) 09/22/2014  . Right hand paresthesia 09/22/2014  . Adiposity 09/22/2014  . Obstructive sleep apnea 09/22/2014  . Need for vaccination with Comvax 09/22/2014  . Compulsive tobacco user syndrome 09/22/2014    Past Surgical History:  Procedure Laterality Date  . COLONOSCOPY  2016  . LIPOMA EXCISION  1984  . NASAL FRACTURE SURGERY  1988  . UMBILICAL HERNIA REPAIR N/A 04/15/2015   Procedure: HERNIA REPAIR UMBILICAL ADULT;  Surgeon: Kieth Brightly, MD;  Location: [facility] ORS;  Service: General;  Laterality: N/A;    Prior to Admission medications   Medication Sig Start Date End Date Taking? Authorizing Provider  ALPRAZolam Prudy Feeler) 0.5 MG tablet Take 1 tablet (0.5 mg total) by mouth daily as needed. 08/26/16   Ellyn Hack, MD  citalopram (CELEXA) 20 MG tablet Take 1 tablet (20 mg total) by mouth daily. 05/28/16   Ellyn Hack, MD  fluticasone Aleda Grana)  50 MCG/ACT nasal spray Place 2 sprays into both nostrils daily. 05/28/16   Ellyn HackSyed Asad A Shah, MD  glucose blood test strip 1 strip 2 (two) times daily. Use as directed to check BG BID 04/14/13   Ellyn HackSyed Asad A Shah, MD  ibuprofen (ADVIL,MOTRIN) 200 MG tablet Take 200 mg by mouth every 6 (six) hours as needed.    Historical Provider, MD  metFORMIN (GLUCOPHAGE) 1000 MG tablet Take 1 tablet (1,000 mg total) by mouth 2 (two) times daily with a meal. 05/28/16   Ellyn HackSyed Asad A Shah, MD  rosuvastatin (CRESTOR) 20 MG tablet Take 1 tablet (20 mg total) by mouth at bedtime. 05/28/16   Ellyn HackSyed Asad A Shah, MD  sitaGLIPtin (JANUVIA)  100 MG tablet Take 1 tablet (100 mg total) by mouth daily. 08/26/16   Ellyn HackSyed Asad A Shah, MD    Allergies Codeine and Sulfa antibiotics  Family History  Problem Relation Age of Onset  . Hypertension Mother   . Diabetes Mother   . Hypertension Father   . Diabetes Brother   . Diabetes Maternal Grandmother     Social History Social History  Substance Use Topics  . Smoking status: Current Every Day Smoker    Packs/day: 1.00    Years: 45.00    Types: Cigarettes  . Smokeless tobacco: Current User  . Alcohol use 0.0 oz/week     Comment: occasionally    Review of Systems Constitutional: No fever/chills Eyes: No visual changes. ENT: No sore throat. Cardiovascular:  chest pain. Respiratory: Denies shortness of breath. Gastrointestinal: No abdominal pain.  No nausea, no vomiting.  No diarrhea.  No constipation. Genitourinary: Negative for dysuria. Musculoskeletal: Negative for back pain. Skin: Negative for rash. Neurological: Negative for headaches, focal weakness or numbness.  10-point ROS otherwise negative.  ____________________________________________   PHYSICAL EXAM:  VITAL SIGNS: ED Triage Vitals  Enc Vitals Group     BP 09/07/16 2046 131/76     Pulse Rate 09/07/16 2046 77     Resp 09/07/16 2046 18     Temp 09/07/16 2046 98.6 F (37 C)     Temp Source 09/07/16 2046 Oral     SpO2 09/07/16 2046 95 %     Weight 09/07/16 2047 199 lb (90.3 kg)     Height 09/07/16 2047 5\' 9"  (1.753 m)     Head Circumference --      Peak Flow --      Pain Score 09/07/16 2047 0     Pain Loc --      Pain Edu? --      Excl. in GC? --     Constitutional: Alert and oriented. Well appearing and in no distress. Eyes: Conjunctivae are normal. PERRL. EOMI. Head: Atraumatic. Nose: No congestion/rhinnorhea. Mouth/Throat: Mucous membranes are moist.  Oropharynx non-erythematous. Cardiovascular: Normal rate, regular rhythm. Grossly normal heart sounds.  Good peripheral  circulation. Respiratory: Normal respiratory effort.  No retractions. Lungs CTAB. Gastrointestinal: Soft and nontender. No distention. Positive bowel sounds Musculoskeletal: No lower extremity tenderness nor edema.   Neurologic:  Normal speech and language.  Skin:  Skin is warm, dry and intact.  Psychiatric: Mood and affect are normal.   ____________________________________________   LABS (all labs ordered are listed, but only abnormal results are displayed)  Labs Reviewed  CBC - Abnormal; Notable for the following:       Result Value   WBC 11.6 (*)    All other components within normal limits  COMPREHENSIVE METABOLIC PANEL - Abnormal;  Notable for the following:    Sodium 134 (*)    Glucose, Bld 105 (*)    All other components within normal limits  TROPONIN I  TROPONIN I   ____________________________________________  EKG  ED ECG REPORT I, Rebecka Apley, the attending physician, personally viewed and interpreted this ECG.   Date: 09/07/2016  EKG Time: 2050  Rate: 74  Rhythm: normal sinus rhythm  Axis: normal  Intervals:none  ST&T Change: none  ____________________________________________  RADIOLOGY  CXR ____________________________________________   PROCEDURES  Procedure(s) performed: None  Procedures  Critical Care performed: No  ____________________________________________   INITIAL IMPRESSION / ASSESSMENT AND PLAN / ED COURSE  Pertinent labs & imaging results that were available during my care of the patient were reviewed by me and considered in my medical decision making (see chart for details).  This is a 56 year old male who comes into the hospital today with chest pain. The patient's chest pain at this time is resolved. I will repeat the patient's troponin and disposition the patient once I received the results. His initial blood work is unremarkable and his EKG is unremarkable. I will reassess the patient.  Clinical Course as of Sep 09 202  Tue Sep 08, 2016  0017 No active cardiopulmonary disease. DG Chest 2 View [AW]    Clinical Course User Index [AW] Rebecka Apley, MD   Patient's repeat troponin was negative. He'll be discharged home to follow-up with his primary care physician for further evaluation of his chest pain.  ____________________________________________   FINAL CLINICAL IMPRESSION(S) / ED DIAGNOSES  Final diagnoses:  Chest pain, unspecified type      NEW MEDICATIONS STARTED DURING THIS VISIT:  New Prescriptions   No medications on file     Note:  This document was prepared using Dragon voice recognition software and may include unintentional dictation errors.    Rebecka Apley, MD 09/08/16 [phone]

## 2016-09-08 NOTE — Discharge Instructions (Signed)
Blood work in the emergency department is unremarkable. Please follow-up with your primary care physician for further evaluation and a possible stress test to further evaluate this chest pain.

## 2016-09-28 ENCOUNTER — Encounter: Payer: BLUE CROSS/BLUE SHIELD | Admitting: Family Medicine

## 2016-10-05 ENCOUNTER — Encounter: Payer: Self-pay | Admitting: *Deleted

## 2016-10-05 ENCOUNTER — Ambulatory Visit
Admission: EM | Admit: 2016-10-05 | Discharge: 2016-10-05 | Disposition: A | Discharge status: Home / Self Care | Payer: Self-pay | Attending: Family Medicine | Admitting: Family Medicine

## 2016-10-05 DIAGNOSIS — Z23 Encounter for immunization: Secondary | ICD-10-CM

## 2016-10-05 DIAGNOSIS — S61412A Laceration without foreign body of left hand, initial encounter: Secondary | ICD-10-CM

## 2016-10-05 DIAGNOSIS — W268XXA Contact with other sharp object(s), not elsewhere classified, initial encounter: Secondary | ICD-10-CM

## 2016-10-05 MED ORDER — TETANUS-DIPHTH-ACELL PERTUSSIS 5-2.5-18.5 LF-MCG/0.5 IM SUSP
0.5000 mL | Freq: Once | INTRAMUSCULAR | Status: AC
  Administered 2016-10-05: 0.5 mL via INTRAMUSCULAR

## 2016-10-05 MED ORDER — CEPHALEXIN 500 MG PO CAPS: 500.0000 mg | ORAL_CAPSULE | Freq: Four times a day (QID) | ORAL | 0 refills | Status: AC

## 2016-10-05 MED ORDER — MUPIROCIN 2 % EX OINT: TOPICAL_OINTMENT | CUTANEOUS | 0 refills | Status: DC

## 2016-10-05 NOTE — ED Provider Notes (Signed)
[facility] ____________________________________________  Time seen: Approximately 1230 PM  I have reviewed the triage vital signs and the nursing notes.   HISTORY  Chief Complaint Laceration  HPI JULIS HAUBNER is a 56 y.o. male presenting for evaluation of left hand laceration post mechanical and just prior to arrival. Patient reports that he was trying to pry up a tile off the floor using a putty knife, and reports his right hand slipped causing a putty knife to go into base of left thumb. Patient states minimal pain at this time. Reports area has been bleeding since injury. Unsure of last tetanus immunization. Reports right hand dominant. Denies fall, head injury, loss consciousness, other pain or injury. Declines concern of foreign body. States otherwise feels well.   Denies chest pain, shortness of breath, other extremity pain, extremity swelling or rash. Denies recent sickness. Denies recent antibiotic use.  Brayton El, MD: PCP   Past Medical History:  Diagnosis Date  . Anemia    h/o  . Anxiety   . Arthritis    hands  . Chronic kidney disease    kidney stone   . Colon polyp 2016  . COPD (chronic obstructive pulmonary disease) (HCC)    no inhalers  . Depression   . Diabetes mellitus without complication (HCC)   . Hemorrhoid   . Hyperlipidemia   . Sleep apnea    Uses C-Pap machine     Patient Active Problem List   Diagnosis Date Noted  . Diabetes mellitus without complication (HCC) 03/18/2016  . Sinusitis, chronic 06/18/2015  . Umbilical hernia without obstruction or gangrene 03/19/2015  . Hyperlipidemia 11/22/2014  . Depression 11/22/2014  . Anxiety 09/22/2014  . Routine general medical examination at a health care facility 09/22/2014  . Diabetes mellitus, type 2 (HCC) 09/22/2014  . Right hand paresthesia 09/22/2014  . Adiposity 09/22/2014  . Obstructive sleep apnea 09/22/2014  . Need for vaccination with Comvax 09/22/2014  . Compulsive tobacco  user syndrome 09/22/2014    Past Surgical History:  Procedure Laterality Date  . COLONOSCOPY  2016  . LIPOMA EXCISION  1984  . NASAL FRACTURE SURGERY  1988  . UMBILICAL HERNIA REPAIR N/A 04/15/2015   Procedure: HERNIA REPAIR UMBILICAL ADULT;  Surgeon: Kieth Brightly, MD;  Location: [facility] ORS;  Service: General;  Laterality: N/A;     No current facility-administered medications for this encounter.   Current Outpatient Prescriptions:  .  ALPRAZolam (XANAX) 0.5 MG tablet, Take 1 tablet (0.5 mg total) by mouth daily as needed., Disp: 30 tablet, Rfl: 2 .  citalopram (CELEXA) 20 MG tablet, Take 1 tablet (20 mg total) by mouth daily., Disp: 90 tablet, Rfl: 1 .  fluticasone (FLONASE) 50 MCG/ACT nasal spray, Place 2 sprays into both nostrils daily., Disp: 16 g, Rfl: 2 .  metFORMIN (GLUCOPHAGE) 1000 MG tablet, Take 1 tablet (1,000 mg total) by mouth 2 (two) times daily with a meal., Disp: 180 tablet, Rfl: 1 .  rosuvastatin (CRESTOR) 20 MG tablet, Take 1 tablet (20 mg total) by mouth at bedtime., Disp: 90 tablet, Rfl: 1 .  cephALEXin (KEFLEX) 500 MG capsule, Take 1 capsule (500 mg total) by mouth 4 (four) times daily., Disp: 20 capsule, Rfl: 0 .  glucose blood test strip, 1 strip 2 (two) times daily. Use as directed to check BG BID, Disp: , Rfl:  .  ibuprofen (ADVIL,MOTRIN) 200 MG tablet, Take 200 mg by mouth every 6 (six) hours as needed., Disp: , Rfl:  .  mupirocin ointment (BACTROBAN) 2 %, Apply three times a day for 5 days., Disp: 22 g, Rfl: 0 .  sitaGLIPtin (JANUVIA) 100 MG tablet, Take 1 tablet (100 mg total) by mouth daily., Disp: 90 tablet, Rfl: 0  Allergies Codeine and Sulfa antibiotics  Family History  Problem Relation Age of Onset  . Hypertension Mother   . Diabetes Mother   . Hypertension Father   . Diabetes Brother   . Diabetes Maternal Grandmother     Social History Social History  Substance Use Topics  . Smoking status: Current Every Day Smoker    Packs/day: 1.00      Years: 45.00    Types: Cigarettes  . Smokeless tobacco: Current User  . Alcohol use 0.0 oz/week     Comment: occasionally    Review of Systems Constitutional: No fever/chills Cardiovascular: Denies chest pain. Respiratory: Denies shortness of breath. Musculoskeletal: Negative for back pain. Skin: Negative for rash.   ____________________________________________   PHYSICAL EXAM:  VITAL SIGNS: ED Triage Vitals  Enc Vitals Group     BP 10/05/16 1138 (!) 154/102     Pulse Rate 10/05/16 1138 65     Resp 10/05/16 1138 16     Temp 10/05/16 1138 98.4 F (36.9 C)     Temp Source 10/05/16 1138 Oral     SpO2 10/05/16 1138 98 %     Weight 10/05/16 1140 205 lb (93 kg)     Height 10/05/16 1140  (1.727 m)     Head Circumference --      Peak Flow --      Pain Score --      Pain Loc --      Pain Edu? --      Excl. in GC? --     Constitutional: Alert and oriented. Well appearing and in no acute distress. Cardiovascular: Normal rate, regular rhythm. Grossly normal heart sounds.  Good peripheral circulation. Respiratory: Normal respiratory effort without tachypnea nor retractions. Breath sounds are clear and equal bilaterally. No wheezes, rales, rhonchi. Musculoskeletal:  Steady gait. Neurologic:  Normal speech and language. Speech is normal. No gait instability.  Skin:  Skin is warm, dry. Except: approximately 4 cm linear laceration present to the palmar aspect of first metacarpal, well approximated, mild active bleeding, and minimal tenderness to direct palpation, no point bony tenderness, left hand with full range of motion present, no motor or tendon deficit, normal distal sensation to left thumb with thumb without motor or tendon deficit noted,  no foreign bodies noted.  Psychiatric: Mood and affect are normal. Speech and behavior are normal. Patient exhibits appropriate insight and judgment   ___________________________________________   LABS (all labs ordered are  listed, but only abnormal results are displayed)  Labs Reviewed - No data to display ____________________________________________   PROCEDURES Procedures   Procedure(s) performed:  Procedure explained and verbal consent obtained. Consent: Verbal consent obtained. Written consent not obtained. Risks and benefits: risks, benefits and alternatives were discussed Patient identity confirmed: verbally with patient and hospital-assigned identification number  Consent given by: patient   Laceration Repair Location: left hand Length: 4 cm Foreign bodies: no foreign bodies visualized Tendon involvement: none. No muscle laceration noted. Nerve involvement: none Preparation: Patient was prepped and draped in the usual sterile fashion. Anesthesia with 1% Lidocaine 5 mls Cleaned with betadine. Irrigation solution: saline  Irrigation method: jet lavage Amount of cleaning: copious Repaired with 4-0 nylon  Number of sutures: 6 Technique: simple interrupted  Approximation: loose Patient  tolerate well. Wound well approximated post repair.  Antibiotic ointment and dressing applied.  Wound care instructions provided.  Observe for any signs of infection or other problems.     ____________________________________________   INITIAL IMPRESSION / ASSESSMENT AND PLAN / ED COURSE  Pertinent labs & imaging results that were available during my care of the patient were reviewed by me and considered in my medical decision making (see chart for details).  Very well-appearing patient. No acute distress. Patient sustained left hand laceration base of left thumb palmar aspect. No foreign body noted, no point bony tenderness. Patient states that he is not concerned of bone injury nor foreign body and verbalized understanding of risk of possible retained foreign body as well as bone injury, and patient declines x-ray at this time.  Laceration copiously irrigated. Wound repaired. As dirty object, place patient  on prophylactic cephalexin 5 days as well as topical Bactroban. Tetanus immunization updated. Discussed strict follow-up and return parameters, wound care and return for suture removal in 7-10 days. Discussed indication, risks and benefits of medications with patient.    Discussed follow up with Primary care physician this week as needed. Discussed follow up and return parameters including no resolution or any worsening concerns. Patient verbalized understanding and agreed to plan.   ____________________________________________   FINAL CLINICAL IMPRESSION(S) / ED DIAGNOSES  Final diagnoses:  Laceration of left hand, foreign body presence unspecified, initial encounter     New Prescriptions   CEPHALEXIN (KEFLEX) 500 MG CAPSULE    Take 1 capsule (500 mg total) by mouth 4 (four) times daily.   MUPIROCIN OINTMENT (BACTROBAN) 2 %    Apply three times a day for 5 days.    Note: This dictation was prepared with Dragon dictation along with smaller phrase technology. Any transcriptional errors that result from this process are unintentional.         Renford Dills, NP 10/05/16 2039

## 2016-10-05 NOTE — ED Triage Notes (Signed)
2" laceration to left hand at base of thumb. Done with putty knife.

## 2016-10-05 NOTE — Discharge Instructions (Signed)
Take medication as prescribed. Keep clean as discussed.   Return to Urgent care in 7-10 days for suture removal.   Follow up with your primary care physician this week as needed. Return to Urgent care for new or worsening concerns.

## 2016-11-05 ENCOUNTER — Other Ambulatory Visit: Payer: Self-pay | Admitting: Family Medicine

## 2016-11-05 DIAGNOSIS — F419 Anxiety disorder, unspecified: Secondary | ICD-10-CM

## 2016-12-05 ENCOUNTER — Other Ambulatory Visit: Payer: Self-pay | Admitting: Family Medicine

## 2016-12-05 DIAGNOSIS — F419 Anxiety disorder, unspecified: Secondary | ICD-10-CM

## 2016-12-28 ENCOUNTER — Telehealth: Payer: Self-pay | Admitting: Family Medicine

## 2016-12-28 ENCOUNTER — Other Ambulatory Visit: Payer: Self-pay

## 2016-12-28 DIAGNOSIS — F419 Anxiety disorder, unspecified: Secondary | ICD-10-CM

## 2016-12-28 MED ORDER — ALPRAZOLAM 0.5 MG PO TABS: 0.5000 mg | ORAL_TABLET | Freq: Every day | ORAL | 0 refills | Status: DC | PRN

## 2016-12-28 NOTE — Telephone Encounter (Signed)
Pt needs refill on Alprazolam. Dr Sherryll BurgerShah pt.

## 2016-12-28 NOTE — Telephone Encounter (Signed)
[facility] web site reviewed; Rx approved for a few during primary's absence

## 2017-02-19 ENCOUNTER — Ambulatory Visit (INDEPENDENT_AMBULATORY_CARE_PROVIDER_SITE_OTHER): Payer: Self-pay | Admitting: Family Medicine

## 2017-02-19 ENCOUNTER — Encounter: Payer: Self-pay | Admitting: Family Medicine

## 2017-02-19 VITALS — BP 123/71 | HR 79 | Temp 98.3°F | Resp 16 | Ht 68.0 in | Wt 194.6 lb

## 2017-02-19 DIAGNOSIS — E119 Type 2 diabetes mellitus without complications: Secondary | ICD-10-CM

## 2017-02-19 DIAGNOSIS — F419 Anxiety disorder, unspecified: Secondary | ICD-10-CM

## 2017-02-19 DIAGNOSIS — E785 Hyperlipidemia, unspecified: Secondary | ICD-10-CM

## 2017-02-19 DIAGNOSIS — F3342 Major depressive disorder, recurrent, in full remission: Secondary | ICD-10-CM

## 2017-02-19 DIAGNOSIS — J329 Chronic sinusitis, unspecified: Secondary | ICD-10-CM

## 2017-02-19 LAB — GLUCOSE, POCT (MANUAL RESULT ENTRY): POC Glucose: 145 mg/dl — AB (ref 70–99)

## 2017-02-19 LAB — POCT GLYCOSYLATED HEMOGLOBIN (HGB A1C): HEMOGLOBIN A1C: 7.4

## 2017-02-19 MED ORDER — METFORMIN HCL 1000 MG PO TABS: 1000.0000 mg | ORAL_TABLET | Freq: Two times a day (BID) | ORAL | 1 refills | Status: DC

## 2017-02-19 MED ORDER — ROSUVASTATIN CALCIUM 20 MG PO TABS: 20.0000 mg | ORAL_TABLET | Freq: Every day | ORAL | 1 refills | Status: DC

## 2017-02-19 MED ORDER — FLUTICASONE PROPIONATE 50 MCG/ACT NA SUSP: 2.0000 | Freq: Every day | NASAL | 2 refills | Status: DC

## 2017-02-19 MED ORDER — ALPRAZOLAM 0.5 MG PO TABS: 0.5000 mg | ORAL_TABLET | Freq: Every day | ORAL | 2 refills | Status: DC | PRN

## 2017-02-19 MED ORDER — CITALOPRAM HYDROBROMIDE 20 MG PO TABS: 20.0000 mg | ORAL_TABLET | Freq: Every day | ORAL | 1 refills | Status: DC

## 2017-02-19 MED ORDER — ALPRAZOLAM 0.5 MG PO TABS: 0.5000 mg | ORAL_TABLET | Freq: Every day | ORAL | 0 refills | Status: DC | PRN

## 2017-02-19 NOTE — Progress Notes (Signed)
Name: Trevor Young   MRN: [id number]    DOB: [birth year]   Date:02/19/2017       Progress Note  Subjective  Chief Complaint  Chief Complaint  Patient presents with  . Follow-up    3 mo  . Medication Refill  . Diabetes  . Hyperlipidemia    Diabetes  He presents for his follow-up diabetic visit. He has type 2 diabetes mellitus. His disease course has been stable. Pertinent negatives for diabetes include no chest pain, no fatigue, no polydipsia and no polyuria. Current diabetic treatment includes oral agent (monotherapy). His weight is stable. He is following a generally healthy and diabetic diet. He monitors blood glucose at home 1-2 x per day. His breakfast blood glucose range is generally 110-130 mg/dl.  Hyperlipidemia  This is a chronic problem. The problem is controlled. Recent lipid tests were reviewed and are normal. Pertinent negatives include no chest pain, leg pain, myalgias or shortness of breath. Current antihyperlipidemic treatment includes statins. Risk factors for coronary artery disease include dyslipidemia.  Anxiety  Presents for follow-up visit. Patient reports no chest pain, depressed mood, excessive worry, irritability, panic or shortness of breath. The severity of symptoms is moderate and causing significant distress.    Depression         This is a chronic problem.  The onset quality is gradual.   The problem has been gradually improving since onset.  Associated symptoms include no fatigue and no myalgias.  Past treatments include SSRIs - Selective serotonin reuptake inhibitors.  Past medical history includes anxiety.      Past Medical History:  Diagnosis Date  . Anemia    h/o  . Anxiety   . Arthritis    hands  . Chronic kidney disease    kidney stone   . Colon polyp 2016  . COPD (chronic obstructive pulmonary disease) (HCC)    no inhalers  . Depression   . Diabetes mellitus without complication (HCC)   . Hemorrhoid   . Hyperlipidemia   . Sleep apnea     Uses C-Pap machine     Past Surgical History:  Procedure Laterality Date  . COLONOSCOPY  2016  . LIPOMA EXCISION  1984  . NASAL FRACTURE SURGERY  1988  . UMBILICAL HERNIA REPAIR N/A 04/15/2015   Procedure: HERNIA REPAIR UMBILICAL ADULT;  Surgeon: Kieth Brightly, MD;  Location: [facility] ORS;  Service: General;  Laterality: N/A;    Family History  Problem Relation Age of Onset  . Hypertension Mother   . Diabetes Mother   . Hypertension Father   . Diabetes Brother   . Diabetes Maternal Grandmother     Social History   Social History  . Marital status: Married    Spouse name: N/A  . Number of children: N/A  . Years of education: N/A   Occupational History  . Not on file.   Social History Main Topics  . Smoking status: Current Every Day Smoker    Packs/day: 1.00    Years: 45.00    Types: Cigarettes  . Smokeless tobacco: Current User  . Alcohol use 0.0 oz/week     Comment: occasionally  . Drug use: No  . Sexual activity: Not on file   Other Topics Concern  . Not on file   Social History Narrative  . No narrative on file     Current Outpatient Prescriptions:  .  ALPRAZolam (XANAX) 0.5 MG tablet, Take 1 tablet (0.5 mg total) by mouth daily as needed.,  Disp: 4 tablet, Rfl: 0 .  citalopram (CELEXA) 20 MG tablet, Take 1 tablet (20 mg total) by mouth daily., Disp: 90 tablet, Rfl: 1 .  fluticasone (FLONASE) 50 MCG/ACT nasal spray, Place 2 sprays into both nostrils daily., Disp: 16 g, Rfl: 2 .  glucose blood test strip, 1 strip 2 (two) times daily. Use as directed to check BG BID, Disp: , Rfl:  .  ibuprofen (ADVIL,MOTRIN) 200 MG tablet, Take 200 mg by mouth every 6 (six) hours as needed., Disp: , Rfl:  .  metFORMIN (GLUCOPHAGE) 1000 MG tablet, Take 1 tablet (1,000 mg total) by mouth 2 (two) times daily with a meal., Disp: 180 tablet, Rfl: 1 .  rosuvastatin (CRESTOR) 20 MG tablet, Take 1 tablet (20 mg total) by mouth at bedtime., Disp: 90 tablet, Rfl: 1  Allergies   Allergen Reactions  . Codeine Nausea And Vomiting  . Sulfa Antibiotics      Review of Systems  Constitutional: Negative for fatigue and irritability.  Respiratory: Negative for shortness of breath.   Cardiovascular: Negative for chest pain.  Musculoskeletal: Negative for myalgias.  Endo/Heme/Allergies: Negative for polydipsia.  Psychiatric/Behavioral: Positive for depression.     Objective  Vitals:   02/19/17 1111  BP: 123/71  Pulse: 79  Resp: 16  Temp: 98.3 F (36.8 C)  TempSrc: Oral  SpO2: 97%  Weight: 194 lb 9.6 oz (88.3 kg)  Height: 5\' 8"  (1.727 m)    Physical Exam  Constitutional: He is oriented to person, place, and time and well-developed, well-nourished, and in no distress.  HENT:  Head: Normocephalic and atraumatic.  Nasal mucosal inflammation, turbinate hypertrophy.  Eyes: Pupils are equal, round, and reactive to light. Conjunctivae are normal.  Cardiovascular: Normal rate, regular rhythm and normal heart sounds.   No murmur heard. Pulmonary/Chest: Effort normal and breath sounds normal. No respiratory distress. He has no wheezes.  Abdominal: Soft. Bowel sounds are normal. There is no tenderness.  Musculoskeletal: Normal range of motion.  Neurological: He is alert and oriented to person, place, and time.  Psychiatric: Mood, memory, affect and judgment normal.  Nursing note and vitals reviewed.      Recent Results (from the past 2160 hour(s))  POCT HgB A1C     Status: Abnormal   Collection Time: 02/19/17 11:18 AM  Result Value Ref Range   Hemoglobin A1C 7.4   POCT Glucose (CBG)     Status: Abnormal   Collection Time: 02/19/17 11:18 AM  Result Value Ref Range   POC Glucose 145 (A) 70 - 99 mg/dl     Assessment & Plan  1. Diabetes mellitus without complication (HCC) Point-of-care A1c 7.4%, well-controlled diabetes - POCT HgB A1C - POCT Glucose (CBG) - metFORMIN (GLUCOPHAGE) 1000 MG tablet; Take 1 tablet (1,000 mg total) by mouth 2 (two)  times daily with a meal.  Dispense: 180 tablet; Refill: 1  2. Anxiety Symptoms of anxiety are stable and improved, continuing on alprazolam daily as needed refills provided - ALPRAZolam (XANAX) 0.5 MG tablet; Take 1 tablet (0.5 mg total) by mouth daily as needed.  Dispense: 30 tablet; Refill: 2  3. Chronic sinusitis, unspecified location  - fluticasone (FLONASE) 50 MCG/ACT nasal spray; Place 2 sprays into both nostrils daily.  Dispense: 16 g; Refill: 2  4. Hyperlipidemia, unspecified hyperlipidemia type  - rosuvastatin (CRESTOR) 20 MG tablet; Take 1 tablet (20 mg total) by mouth at bedtime.  Dispense: 90 tablet; Refill: 1  5. Recurrent major depressive disorder, in full remission (HCC)  -  citalopram (CELEXA) 20 MG tablet; Take 1 tablet (20 mg total) by mouth daily.  Dispense: 90 tablet; Refill: 1   Radhika Dershem Asad A. Faylene KurtzShah [facility] 02/19/2017 11:20 AM

## 2017-02-23 ENCOUNTER — Ambulatory Visit: Payer: BLUE CROSS/BLUE SHIELD | Admitting: Family Medicine

## 2017-04-23 ENCOUNTER — Other Ambulatory Visit: Payer: Self-pay | Admitting: Family Medicine

## 2017-04-23 DIAGNOSIS — E119 Type 2 diabetes mellitus without complications: Secondary | ICD-10-CM

## 2017-04-23 DIAGNOSIS — J329 Chronic sinusitis, unspecified: Secondary | ICD-10-CM

## 2017-04-23 DIAGNOSIS — F3342 Major depressive disorder, recurrent, in full remission: Secondary | ICD-10-CM

## 2017-04-23 DIAGNOSIS — E785 Hyperlipidemia, unspecified: Secondary | ICD-10-CM

## 2017-04-23 DIAGNOSIS — F419 Anxiety disorder, unspecified: Secondary | ICD-10-CM

## 2017-04-23 NOTE — Telephone Encounter (Signed)
Copied from CRM [id number]. Topic: Quick Communication - See Telephone Encounter >> Apr 23, 2017 10:22 AM Guinevere FerrariMorris, Veleria Barnhardt E, NT wrote: CRM for notification. See Telephone encounter for:  04/23/17. Pt. Called in to see if a can get a refill on all of his medications.Medications are metFORMIN (GLUCOPHAGE) 1000 MG tablet,citalopram (CELEXA) 20 MG tablet, ALPRAZolam (XANAX) 0.5 MG tablet, fluticasone (FLONASE) 50 MCG/ACT nasal spray, and rosuvastatin (CRESTOR) 20 MG tablet.

## 2017-05-19 ENCOUNTER — Other Ambulatory Visit: Payer: Self-pay | Admitting: Family Medicine

## 2017-05-19 DIAGNOSIS — F419 Anxiety disorder, unspecified: Secondary | ICD-10-CM

## 2017-08-09 ENCOUNTER — Other Ambulatory Visit: Payer: Self-pay | Admitting: Family Medicine

## 2017-08-09 DIAGNOSIS — F3342 Major depressive disorder, recurrent, in full remission: Secondary | ICD-10-CM

## 2017-08-11 ENCOUNTER — Encounter: Payer: Self-pay | Admitting: Family Medicine

## 2017-08-11 ENCOUNTER — Ambulatory Visit: Payer: No Typology Code available for payment source | Admitting: Family Medicine

## 2017-08-11 VITALS — BP 100/70 | HR 82 | Resp 14 | Ht 68.0 in | Wt 202.7 lb

## 2017-08-11 DIAGNOSIS — Z114 Encounter for screening for human immunodeficiency virus [HIV]: Secondary | ICD-10-CM

## 2017-08-11 DIAGNOSIS — Z1159 Encounter for screening for other viral diseases: Secondary | ICD-10-CM | POA: Diagnosis not present

## 2017-08-11 DIAGNOSIS — J3089 Other allergic rhinitis: Secondary | ICD-10-CM | POA: Diagnosis not present

## 2017-08-11 DIAGNOSIS — Z23 Encounter for immunization: Secondary | ICD-10-CM | POA: Diagnosis not present

## 2017-08-11 DIAGNOSIS — G4733 Obstructive sleep apnea (adult) (pediatric): Secondary | ICD-10-CM | POA: Diagnosis not present

## 2017-08-11 DIAGNOSIS — F3342 Major depressive disorder, recurrent, in full remission: Secondary | ICD-10-CM | POA: Diagnosis not present

## 2017-08-11 DIAGNOSIS — Z716 Tobacco abuse counseling: Secondary | ICD-10-CM | POA: Diagnosis not present

## 2017-08-11 DIAGNOSIS — E785 Hyperlipidemia, unspecified: Secondary | ICD-10-CM

## 2017-08-11 DIAGNOSIS — E119 Type 2 diabetes mellitus without complications: Secondary | ICD-10-CM | POA: Diagnosis not present

## 2017-08-11 DIAGNOSIS — J329 Chronic sinusitis, unspecified: Secondary | ICD-10-CM | POA: Diagnosis not present

## 2017-08-11 DIAGNOSIS — E1169 Type 2 diabetes mellitus with other specified complication: Secondary | ICD-10-CM

## 2017-08-11 LAB — POCT UA - MICROALBUMIN: Microalbumin Ur, POC: 20 mg/L

## 2017-08-11 LAB — POCT GLYCOSYLATED HEMOGLOBIN (HGB A1C): HEMOGLOBIN A1C: 7.7

## 2017-08-11 MED ORDER — FLUTICASONE PROPIONATE 50 MCG/ACT NA SUSP: 2.0000 | Freq: Every day | NASAL | 2 refills | Status: AC

## 2017-08-11 MED ORDER — CITALOPRAM HYDROBROMIDE 20 MG PO TABS: 20.0000 mg | ORAL_TABLET | Freq: Every day | ORAL | 1 refills | Status: AC

## 2017-08-11 MED ORDER — HYDROXYZINE HCL 10 MG PO TABS: 10.0000 mg | ORAL_TABLET | Freq: Three times a day (TID) | ORAL | 0 refills | Status: AC | PRN

## 2017-08-11 MED ORDER — METFORMIN HCL ER 750 MG PO TB24
750.0000 mg | ORAL_TABLET | Freq: Every day | ORAL | 1 refills | Status: AC
Start: 2017-08-11 — End: ?

## 2017-08-11 MED ORDER — ASPIRIN EC 81 MG PO TBEC: 81.0000 mg | DELAYED_RELEASE_TABLET | Freq: Every day | ORAL | 0 refills | Status: DC

## 2017-08-11 MED ORDER — ROSUVASTATIN CALCIUM 20 MG PO TABS: 20.0000 mg | ORAL_TABLET | Freq: Every day | ORAL | 1 refills | Status: AC

## 2017-08-11 NOTE — Patient Instructions (Signed)
Obesity, Adult Obesity is the condition of having too much total body fat. Being overweight or obese means that your weight is greater than what is considered healthy for your body size. Obesity is determined by a measurement called BMI. BMI is an estimate of body fat and is calculated from height and weight. For adults, a BMI of 30 or higher is considered obese. Obesity can eventually lead to other health concerns and major illnesses, including:  Stroke.  Coronary artery disease (CAD).  Type 2 diabetes.  Some types of cancer, including cancers of the colon, breast, uterus, and gallbladder.  Osteoarthritis.  High blood pressure (hypertension).  High cholesterol.  Sleep apnea.  Gallbladder stones.  Infertility problems.  What are the causes? The main cause of obesity is taking in (consuming) more calories than your body uses for energy. Other factors that contribute to this condition may include:  Being born with genes that make you more likely to become obese.  Having a medical condition that causes obesity. These conditions include: ? Hypothyroidism. ? Polycystic ovarian syndrome (PCOS). ? Binge-eating disorder. ? Cushing syndrome.  Taking certain medicines, such as steroids, antidepressants, and seizure medicines.  Not being physically active (sedentary lifestyle).  Living where there are limited places to exercise safely or buy healthy foods.  Not getting enough sleep.  What increases the risk? The following factors may increase your risk of this condition:  Having a family history of obesity.  Being a woman of African-American descent.  Being a man of Hispanic descent.  What are the signs or symptoms? Having excessive body fat is the main symptom of this condition. How is this diagnosed? This condition may be diagnosed based on:  Your symptoms.  Your medical history.  A physical exam. Your health care provider may measure: ? Your BMI. If you are an  adult with a BMI between 25 and less than 30, you are considered overweight. If you are an adult with a BMI of 30 or higher, you are considered obese. ? The distances around your hips and your waist (circumferences). These may be compared to each other to help diagnose your condition. ? Your skinfold thickness. Your health care provider may gently pinch a fold of your skin and measure it.  How is this treated? Treatment for this condition often includes changing your lifestyle. Treatment may include some or all of the following:  Dietary changes. Work with your health care provider and a dietitian to set a weight-loss goal that is healthy and reasonable for you. Dietary changes may include eating: ? Smaller portions. A portion size is the amount of a particular food that is healthy for you to eat at one time. This varies from person to person. ? Low-calorie or low-fat options. ? More whole grains, fruits, and vegetables.  Regular physical activity. This may include aerobic activity (cardio) and strength training.  Medicine to help you lose weight. Your health care provider may prescribe medicine if you are unable to lose 1 pound a week after 6 weeks of eating more healthily and doing more physical activity.  Surgery. Surgical options may include gastric banding and gastric bypass. Surgery may be done if: ? Other treatments have not helped to improve your condition. ? You have a BMI of 40 or higher. ? You have life-threatening health problems related to obesity.  Follow these instructions at home:  Eating and drinking   Follow recommendations from your health care provider about what you eat and drink. Your health   care provider may advise you to: ? Limit fast foods, sweets, and processed snack foods. ? Choose low-fat options, such as low-fat milk instead of whole milk. ? Eat 5 or more servings of fruits or vegetables every day. ? Eat at home more often. This gives you more control over  what you eat. ? Choose healthy foods when you eat out. ? Learn what a healthy portion size is. ? Keep low-fat snacks on hand. ? Avoid sugary drinks, such as soda, fruit juice, iced tea sweetened with sugar, and flavored milk. ? Eat a healthy breakfast.  Drink enough water to keep your urine clear or pale yellow.  Do not go without eating for long periods of time (do not fast) or follow a fad diet. Fasting and fad diets can be unhealthy and even dangerous. Physical Activity  Exercise regularly, as told by your health care provider. Ask your health care provider what types of exercise are safe for you and how often you should exercise.  Warm up and stretch before being active.  Cool down and stretch after being active.  Rest between periods of activity. Lifestyle  Limit the time that you spend in front of your TV, computer, or video game system.  Find ways to reward yourself that do not involve food.  Limit alcohol intake to no more than 1 drink a day for nonpregnant women and 2 drinks a day for men. One drink equals 12 oz of beer, 5 oz of wine, or 1 oz of hard liquor. General instructions  Keep a weight loss journal to keep track of the food you eat and how much you exercise you get.  Take over-the-counter and prescription medicines only as told by your health care provider.  Take vitamins and supplements only as told by your health care provider.  Consider joining a support group. Your health care provider may be able to recommend a support group.  Keep all follow-up visits as told by your health care provider. This is important. Contact a health care provider if:  You are unable to meet your weight loss goal after 6 weeks of dietary and lifestyle changes. This information is not intended to replace advice given to you by your health care provider. Make sure you discuss any questions you have with your health care provider. Document Released: 07/16/2004 Document Revised:  11/11/2015 Document Reviewed: 03/27/2015 Elsevier Interactive Patient Education  2018 [employer] Smoking Tobacco Information Smoking tobacco will very likely harm your health. Tobacco contains a poisonous (toxic), colorless chemical called nicotine. Nicotine affects the brain and makes tobacco addictive. This change in your brain can make it hard to stop smoking. Tobacco also has other toxic chemicals that can hurt your body and raise your risk of many cancers. How can smoking tobacco affect me? Smoking tobacco can increase your chances of having serious health conditions, such as:  Cancer. Smoking is most commonly associated with lung cancer, but can lead to cancer in other parts of the body.  Chronic obstructive pulmonary disease (COPD). This is a long-term lung condition that makes it hard to breathe. It also gets worse over time.  High blood pressure (hypertension), heart disease, stroke, or heart attack.  Lung infections, such as pneumonia.  Cataracts. This is when the lenses in the eyes become clouded.  Digestive problems. This may include peptic ulcers, heartburn, and gastroesophageal reflux disease (GERD).  Oral health problems, such as gum disease and tooth loss.  Loss of taste and smell.  Smoking can  affect your appearance by causing:  Wrinkles.  Yellow or stained teeth, fingers, and fingernails.  Smoking tobacco can also affect your social life.  Many workplaces, [employer], hotels, and public places are tobacco-free. This means that you may experience challenges in finding places to smoke when away from home.  The cost of a smoking habit can be expensive. Expenses for someone who smokes come in two ways: ? You spend money on a regular basis to buy tobacco. ? Your health care costs in the long-term are higher if you smoke.  Tobacco smoke can also affect the health of those around you. Children of smokers have greater chances of: ? Sudden infant death syndrome  (SIDS). ? Ear infections. ? Lung infections.  What lifestyle changes can be made?  Do not start smoking. Quit if you already do.  To quit smoking: ? Make a plan to quit smoking and commit yourself to it. Look for programs to help you and ask your health care provider for recommendations and ideas. ? Talk with your health care provider about using nicotine replacement medicines to help you quit. Medicine replacement medicines include gum, lozenges, patches, sprays, or pills. ? Do not replace cigarette smoking with electronic cigarettes, which are commonly called e-cigarettes. The safety of e-cigarettes is not known, and some may contain harmful chemicals. ? Avoid places, people, or situations that tempt you to smoke. ? If you try to quit but return to smoking, don't give up hope. It is very common for people to try a number of times before they fully succeed. When you feel ready again, give it another try.  Quitting smoking might affect the way you eat as well as your weight. Be prepared to monitor your eating habits. Get support in planning and following a healthy diet.  Ask your health care provider about having regular tests (screenings) to check for cancer. This may include blood tests, imaging tests, and other tests.  Exercise regularly. Consider taking walks, joining a gym, or doing yoga or exercise classes.  Develop skills to manage your stress. These skills include meditation. What are the benefits of quitting smoking? By quitting smoking, you may:  Lower your risk of getting cancer and other diseases caused by smoking.  Live longer.  Breathe better.  Lower your blood pressure and heart rate.  Stop your addiction to tobacco.  Stop creating secondhand smoke that hurts other people.  Improve your sense of taste and smell.  Look better over time, due to having fewer wrinkles and less staining.  What can happen if changes are not made? If you do not stop smoking, you  may:  Get cancer and other diseases.  Develop COPD or other long-term (chronic) lung conditions.  Develop serious problems with your heart and blood vessels (cardiovascular system).  Need more tests to screen for problems caused by smoking.  Have higher, long-term healthcare costs from medicines or treatments related to smoking.  Continue to have worsening changes in your lungs, mouth, and nose.  Where to find support: To get support to quit smoking, consider:  Asking your health care provider for more information and resources.  Taking classes to learn more about quitting smoking.  Looking for local organizations that offer resources about quitting smoking.  Joining a support group for people who want to quit smoking in your local community.  Where to find more information: You may find more information about quitting smoking from:  HelpGuide.org: www.helpguide.org/articles/addictions/how-to-quit-smoking.htm  [employer]: smokefree.gov  American Lung Association: www.lung.org  Contact a health care provider if:  You have problems breathing.  Your lips, nose, or fingers turn blue.  You have chest pain.  You are coughing up blood.  You feel faint or you pass out.  You have other noticeable changes that cause you to worry. Summary  Smoking tobacco can negatively affect your health, the health of those around you, your finances, and your social life.  Do not start smoking. Quit if you already do. If you need help quitting, ask your health care provider.  Think about joining a support group for people who want to quit smoking in your local community. There are many effective programs that will help you to quit this behavior. This information is not intended to replace advice given to you by your health care provider. Make sure you discuss any questions you have with your health care provider. Document Released: 06/23/2016 Document Revised: 06/23/2016 Document  Reviewed: 06/23/2016 Elsevier Interactive Patient Education  [employer].

## 2017-08-11 NOTE — Progress Notes (Signed)
Name: Trevor Young   MRN: [id number]    DOB: 08-16-1960   Date:08/11/2017       Progress Note  Subjective  Chief Complaint  Chief Complaint  Patient presents with  . Diabetes  . Hyperlipidemia  . Medication Refill  . Anxiety  . Depression    HPI  DMII: he was diagnosed with DM 2017  currently on Metformin 1000 mg twice daily, but only taking once daily. HgbA1C has not been controlled since diagnosed, hovering above 7. He still drinks sodas and likes candy bars. Explained importance of getting diabetes controlled. He denies polyphagia, polydipsia or polyuria. He is due for yearly eye exam. We will change to metformin ER and discussed importance of diet, if glucose not at goal by next visit we will change regiment. He has dyslipidemia and is on Crestor.   Major Depression: he has episodes of anxiety also, used to take alprazolam but has been out of medication. Citalopram helps his mood and is keeping anger under control. No suicidal thoughts or ideation. Thinks he is in remission  AR: using nasal steroid daily and helps with congestion and rhinorrhea  Tobacco use, likely COPD: he still smokes, but down to half pack daily, he has a cough most days of the week, usually dry, sometimes productive, he denies SOB, no wheezing. He is willing to quit smoking, we will check spirometry today   OSA: he uses CPAP every night, denies headaches, still snoring.   Obesity: he will resume diet, needs to drop BMI to below 30    Patient Active Problem List   Diagnosis Date Noted  . Recurrent major depressive disorder, in full remission (HCC) 08/11/2017  . Perennial allergic rhinitis 08/11/2017  . Sinusitis, chronic 06/18/2015  . Hyperlipidemia 11/22/2014  . Dyslipidemia associated with type 2 diabetes mellitus (HCC) 09/22/2014  . Adiposity 09/22/2014  . Obstructive sleep apnea 09/22/2014  . Compulsive tobacco user syndrome 09/22/2014    Past Surgical History:  Procedure Laterality Date  .  COLONOSCOPY  2016  . LIPOMA EXCISION  1984  . NASAL FRACTURE SURGERY  1988  . UMBILICAL HERNIA REPAIR N/A 04/15/2015   Procedure: HERNIA REPAIR UMBILICAL ADULT;  Surgeon: Kieth BrightlySeeplaputhur G Sankar, MD;  Location: [facility] ORS;  Service: General;  Laterality: N/A;    Family History  Problem Relation Age of Onset  . Hypertension Mother   . Diabetes Mother   . Hypertension Father   . Diabetes Brother   . Diabetes Maternal Grandmother     Social History   Socioeconomic History  . Marital status: Married    Spouse name: Not on file  . Number of children: Not on file  . Years of education: Not on file  . Highest education level: Not on file  Social Needs  . Financial resource strain: Not on file  . Food insecurity - worry: Not on file  . Food insecurity - inability: Not on file  . Transportation needs - medical: Not on file  . Transportation needs - non-medical: Not on file  Occupational History  . Not on file  Tobacco Use  . Smoking status: Current Every Day Smoker    Packs/day: 1.00    Years: 45.00    Pack years: 45.00    Types: Cigarettes  . Smokeless tobacco: Current User  Substance and Sexual Activity  . Alcohol use: Yes    Alcohol/week: 0.0 oz    Comment: occasionally  . Drug use: No  . Sexual activity: Not on file  Other Topics Concern  . Not on file  Social History Narrative  . Not on file     Current Outpatient Medications:  .  citalopram (CELEXA) 20 MG tablet, Take 1 tablet (20 mg total) by mouth daily., Disp: 90 tablet, Rfl: 1 .  fluticasone (FLONASE) 50 MCG/ACT nasal spray, Place 2 sprays into both nostrils daily., Disp: 16 g, Rfl: 2 .  glucose blood test strip, 1 strip 2 (two) times daily. Use as directed to check BG BID, Disp: , Rfl:  .  ibuprofen (ADVIL,MOTRIN) 200 MG tablet, Take 200 mg by mouth every 6 (six) hours as needed., Disp: , Rfl:  .  rosuvastatin (CRESTOR) 20 MG tablet, Take 1 tablet (20 mg total) by mouth at bedtime., Disp: 90 tablet, Rfl: 1 .   hydrOXYzine (ATARAX/VISTARIL) 10 MG tablet, Take 1 tablet (10 mg total) by mouth 3 (three) times daily as needed., Disp: 30 tablet, Rfl: 0 .  metFORMIN (GLUCOPHAGE-XR) 750 MG 24 hr tablet, Take 1 tablet (750 mg total) by mouth daily with breakfast., Disp: 180 tablet, Rfl: 1  Allergies  Allergen Reactions  . Codeine Nausea And Vomiting  . Sulfa Antibiotics      ROS  Constitutional: Negative for fever or weight change.  Respiratory: Negative for cough and shortness of breath.   Cardiovascular: Negative for chest pain or palpitations.  Gastrointestinal: Negative for abdominal pain, no bowel changes.  Musculoskeletal: Negative for gait problem or joint swelling.  Skin: Negative for rash.  Neurological: Negative for dizziness or headache.  No other specific complaints in a complete review of systems (except as listed in HPI above).  Objective  Vitals:   08/11/17 1201  BP: 100/70  Pulse: 82  Resp: 14  SpO2: 97%  Weight: 202 lb 11.2 oz (91.9 kg)  Height: 5\' 8"  (1.727 m)    Body mass index is 30.82 kg/m.  Physical Exam   Constitutional: Patient appears well-developed and well-nourished. Obese  No distress.  HEENT: head atraumatic, normocephalic, pupils equal and reactive to light, , neck supple, throat within normal limits Cardiovascular: Normal rate, regular rhythm and normal heart sounds.  No murmur heard. No BLE edema. Pulmonary/Chest: Effort normal and breath sounds normal. No respiratory distress. Abdominal: Soft.  There is no tenderness. Psychiatric: Patient has a normal mood and affect. behavior is normal. Judgment and thought content normal.  Diabetic Foot Exam - Simple   Simple Foot Form Diabetic Foot exam was performed with the following findings:  Yes 08/11/2017  1:25 PM  Visual Inspection No deformities, no ulcerations, no other skin breakdown bilaterally:  Yes Sensation Testing Intact to touch and monofilament testing bilaterally:  Yes Pulse Check Posterior  Tibialis and Dorsalis pulse intact bilaterally:  Yes Comments     PHQ2/9: Depression screen [facility] 2/9 08/11/2017 02/19/2017 08/26/2016 05/28/2016 03/18/2016  Decreased Interest 0 0 0 0 0  Down, Depressed, Hopeless 0 0 0 0 0  PHQ - 2 Score 0 0 0 0 0  Altered sleeping 0 - - - -  Tired, decreased energy 1 - - - -  Change in appetite 0 - - - -  Feeling bad or failure about yourself  0 - - - -  Trouble concentrating 0 - - - -  Moving slowly or fidgety/restless 0 - - - -  Suicidal thoughts 0 - - - -  PHQ-9 Score 1 - - - -  Difficult doing work/chores Not difficult at all - - - -     Fall Risk:  Fall Risk  08/11/2017 02/19/2017 08/26/2016 05/28/2016 03/18/2016  Falls in the past year? No Yes No No No  Number falls in past yr: - 1 - - -  Injury with Fall? - No - - -  Follow up - Falls evaluation completed - - -      Functional Status Survey: Is the patient deaf or have difficulty hearing?: No Does the patient have difficulty seeing, even when wearing glasses/contacts?: No Does the patient have difficulty concentrating, remembering, or making decisions?: No Does the patient have difficulty walking or climbing stairs?: No Does the patient have difficulty dressing or bathing?: No Does the patient have difficulty doing errands alone such as visiting a doctor's office or shopping?: No    Assessment & Plan  1. Dyslipidemia associated with type 2 diabetes mellitus (HCC)  hgbA1C is not at goal, states only taking Metformin 1000 mg once a day, we will try to change to ER and he will try to resume diet. - POCT UA - Microalbumin - POCT HgB A1C - rosuvastatin (CRESTOR) 20 MG tablet; Take 1 tablet (20 mg total) by mouth at bedtime.  Dispense: 90 tablet; Refill: 1 - metFORMIN (GLUCOPHAGE-XR) 750 MG 24 hr tablet; Take 1 tablet (750 mg total) by mouth daily with breakfast.  Dispense: 180 tablet; Refill: 1  2. Obstructive sleep apnea  Wears machine every night  3. Recurrent major depressive disorder,  in full remission (HCC)  Doing well, stop alprazolam, try hydroxizine prn for anxiety  - citalopram (CELEXA) 20 MG tablet; Take 1 tablet (20 mg total) by mouth daily.  Dispense: 90 tablet; Refill: 1 - hydrOXYzine (ATARAX/VISTARIL) 10 MG tablet; Take 1 tablet (10 mg total) by mouth 3 (three) times daily as needed.  Dispense: 30 tablet; Refill: 0  4. Perennial allergic rhinitis  stable  5. Encounter for tobacco use cessation counseling  He does not want medication  Spirometry: FEV1/FVC above 80 %  6. Chronic sinusitis, unspecified location  - fluticasone (FLONASE) 50 MCG/ACT nasal spray; Place 2 sprays into both nostrils daily.  Dispense: 16 g; Refill: 2   8. Needs flu shot  refused  9. Need for pneumococcal vaccine  refused  10. Need for hepatitis C screening test  - Hepatitis C antibody  11. Encounter for screening for HIV  - HIV antibody (with reflex)

## 2017-08-12 LAB — HEPATITIS C ANTIBODY
Hepatitis C Ab: NONREACTIVE
SIGNAL TO CUT-OFF: 0.02 (ref <1.00)

## 2017-08-12 LAB — HIV ANTIBODY (ROUTINE TESTING W REFLEX): HIV 1&2 Ab, 4th Generation: NONREACTIVE

## 2017-10-14 IMAGING — CR DG CHEST 2V
1 series · 2 of 2 positions shown · non-contrast
Comparison: 07/18/2013

CLINICAL DATA: Chest pain and dizziness

EXAM:
CHEST  2 VIEW

[Series 1: dg chest 2 view · 0.14mm/px · 2 of 2 slices shown]
[im 1/2]
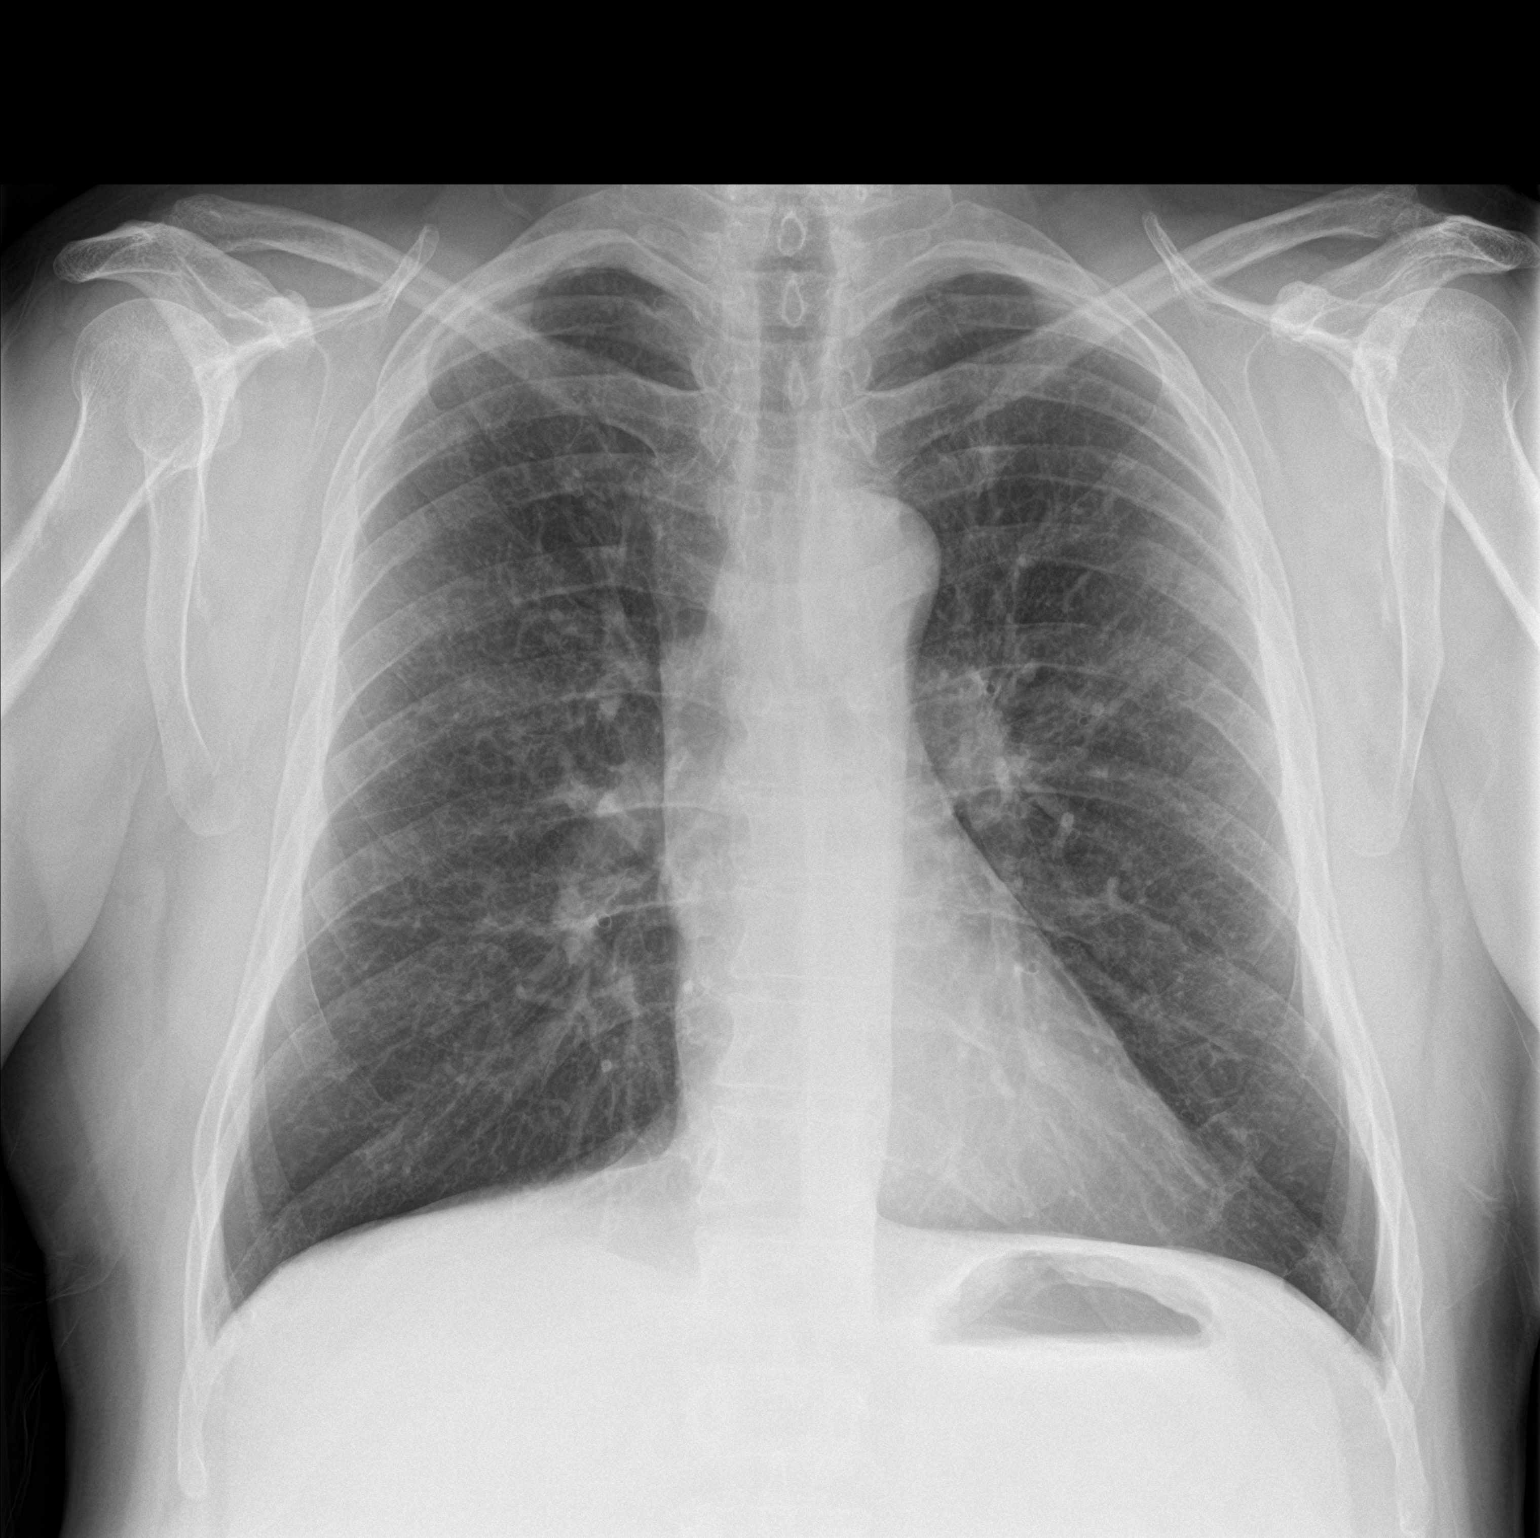
[im 2/2]
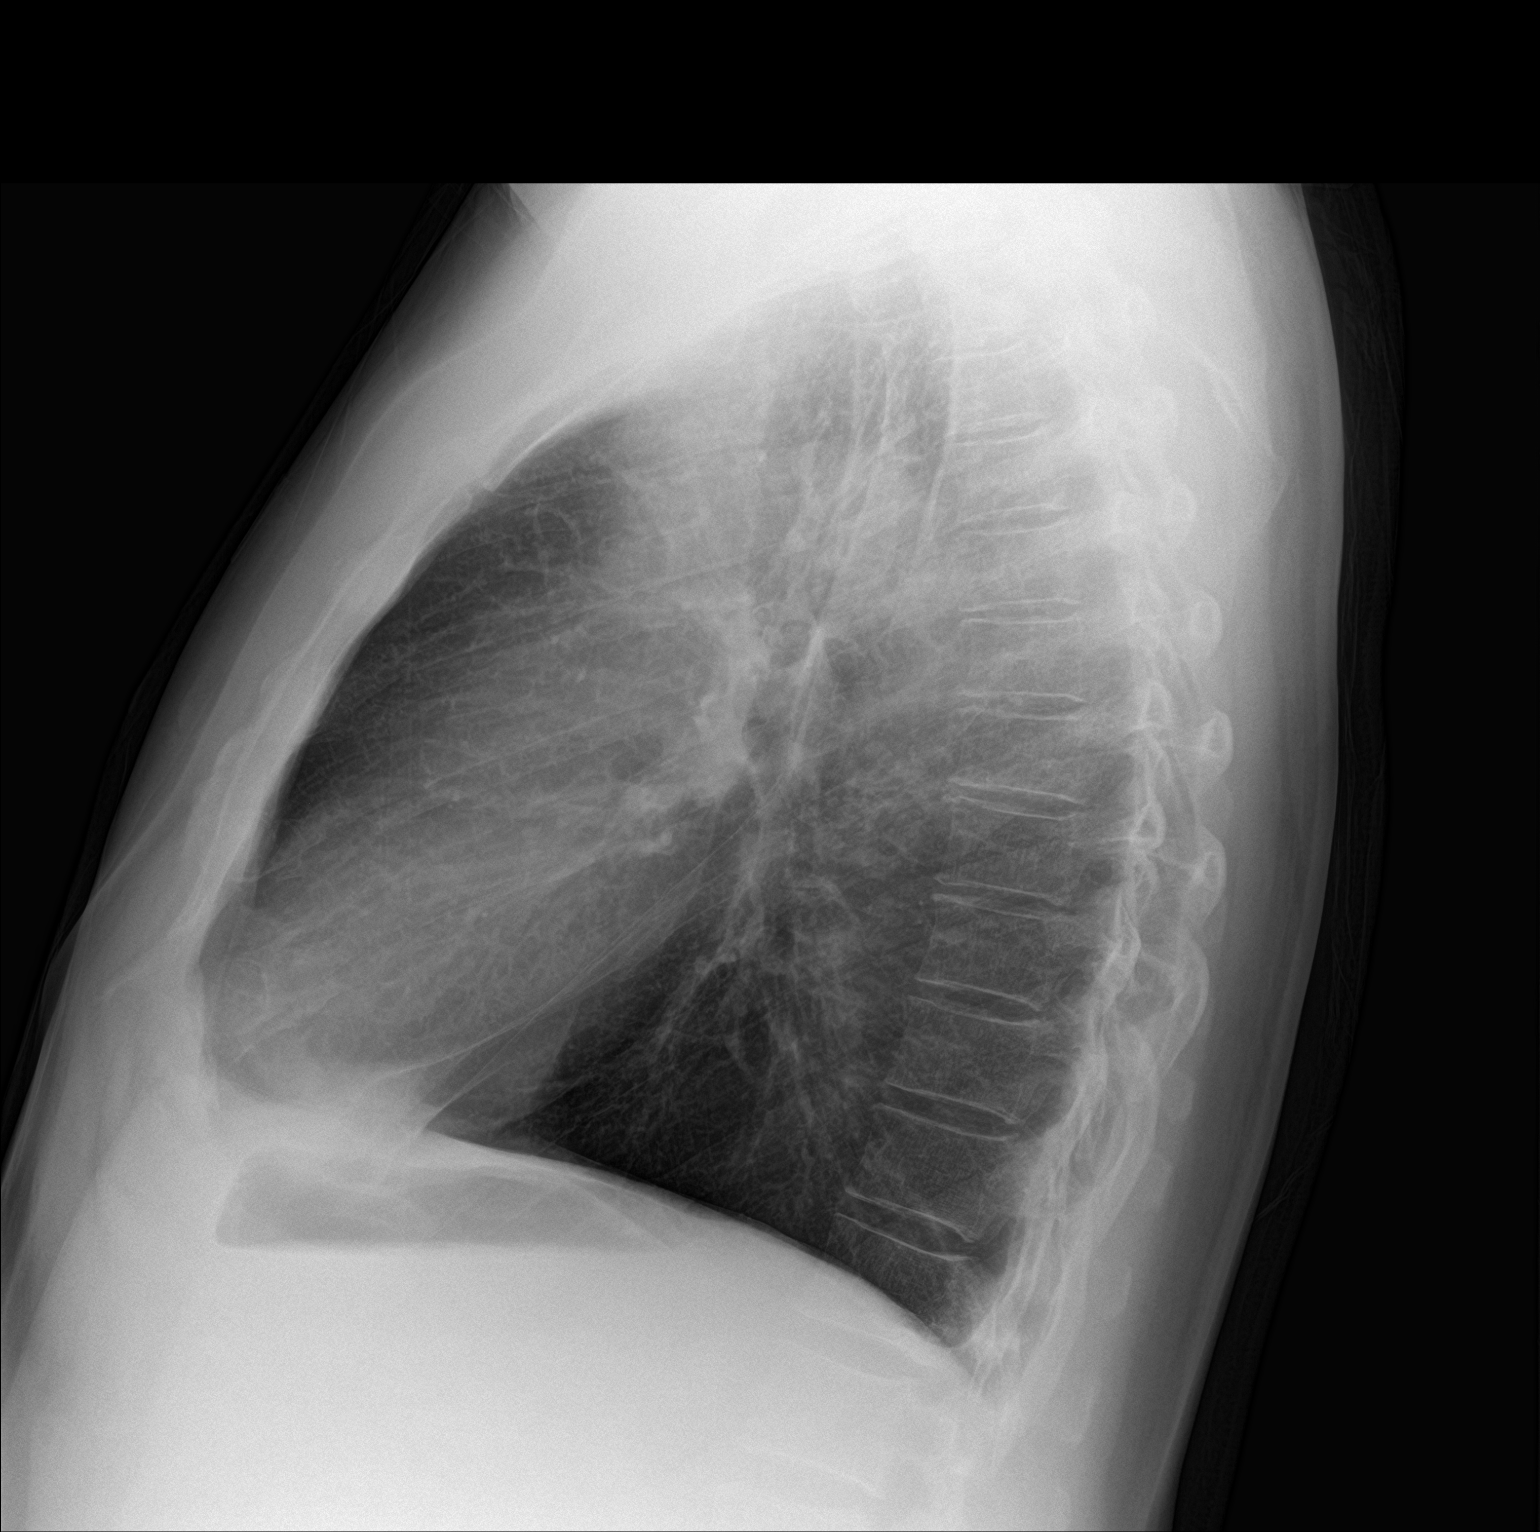

[2 of 2 positions shown; findings below may reference images not displayed]

FINDINGS: The heart size and mediastinal contours are within normal limits.
Both lungs are clear. The visualized skeletal structures are
unremarkable.
IMPRESSION: No active cardiopulmonary disease.

## 2017-11-12 ENCOUNTER — Ambulatory Visit: Payer: No Typology Code available for payment source | Admitting: Family Medicine

## 2018-08-21 ENCOUNTER — Other Ambulatory Visit: Payer: Self-pay | Admitting: Family Medicine

## 2018-08-21 DIAGNOSIS — J329 Chronic sinusitis, unspecified: Secondary | ICD-10-CM

## 2018-08-22 NOTE — Telephone Encounter (Signed)
He needs follow up, last visit was one year ago. Please schedule and I will send one refill

## 2018-08-22 NOTE — Telephone Encounter (Signed)
Tried to contact pt but he does not have a working number.

## 2018-08-23 NOTE — Telephone Encounter (Signed)
Tried one more time and number not working

## 2018-10-04 ENCOUNTER — Other Ambulatory Visit (HOSPITAL_COMMUNITY): Payer: Self-pay | Admitting: Urology

## 2018-10-04 ENCOUNTER — Other Ambulatory Visit: Payer: Self-pay | Admitting: Urology

## 2018-10-04 DIAGNOSIS — R319 Hematuria, unspecified: Secondary | ICD-10-CM

## 2018-10-06 ENCOUNTER — Ambulatory Visit: Admission: RE | Admit: 2018-10-06 | Payer: Self-pay | Source: Ambulatory Visit

## 2019-03-17 ENCOUNTER — Other Ambulatory Visit: Payer: Self-pay | Admitting: Family Medicine

## 2019-03-17 DIAGNOSIS — J329 Chronic sinusitis, unspecified: Secondary | ICD-10-CM

## 2019-03-17 NOTE — Telephone Encounter (Signed)
Requested medication (s) are due for refill today: yes  Requested medication (s) are on the active medication list: yes  Last refill:  07/22/2018  Future visit scheduled: no  Notes to clinic:  Review for refill   Requested Prescriptions  Pending Prescriptions Disp Refills   fluticasone (FLONASE) 50 MCG/ACT nasal spray [Pharmacy Med Name: fluticasone propionate 50 mcg/actuation nasal spray,suspension] 16 g 2    Sig: INHALE 2 SPRAYS IN EACH NOSTRIL ONCE DAILY     Ear, Nose, and Throat: Nasal Preparations - Corticosteroids Failed - 03/17/2019  1:31 PM      Failed - Valid encounter within last 12 months    Recent Outpatient Visits          1 year ago Dyslipidemia associated with type 2 diabetes mellitus [facility])   [facility] Steele Sizer, MD   2 years ago Diabetes mellitus without complication [facility])   [facility] Roselee Nova, MD   2 years ago Uncontrolled type 2 diabetes mellitus without complication, without long-term current use of insulin [facility])   [facility] Roselee Nova, MD   2 years ago [place], Syed Asad A, MD   2 years ago [place], Syed Asad A, MD

## 2019-12-29 ENCOUNTER — Other Ambulatory Visit: Payer: Self-pay | Admitting: Urology

## 2019-12-29 DIAGNOSIS — R319 Hematuria, unspecified: Secondary | ICD-10-CM

## 2023-03-20 ENCOUNTER — Encounter: Payer: Self-pay | Admitting: Emergency Medicine

## 2023-03-20 ENCOUNTER — Ambulatory Visit
Admission: EM | Admit: 2023-03-20 | Discharge: 2023-03-20 | Disposition: A | Discharge status: Home / Self Care | Payer: 59

## 2023-03-20 DIAGNOSIS — J441 Chronic obstructive pulmonary disease with (acute) exacerbation: Secondary | ICD-10-CM | POA: Diagnosis not present

## 2023-03-20 DIAGNOSIS — R051 Acute cough: Secondary | ICD-10-CM | POA: Diagnosis not present

## 2023-03-20 MED ORDER — AMOXICILLIN-POT CLAVULANATE 875-125 MG PO TABS: 1.0000 | ORAL_TABLET | Freq: Two times a day (BID) | ORAL | 0 refills | Status: AC

## 2023-03-20 NOTE — ED Triage Notes (Signed)
Patient c/o cough, chest congestion, chest tightness and sinus pressure, congestion that started a week ago.  Patient denies fevers.

## 2023-03-20 NOTE — ED Provider Notes (Signed)
[facility]    CSN: [id number] Arrival date & time: 03/20/23  1046      History   Chief Complaint Chief Complaint  Patient presents with   Cough   Sinus Problem    HPI Trevor Young is a 62 y.o. male with history of diabetes, COPD, continued tobacco abuse, allergies, hyperlipidemia, sleep apnea.  Patient presents today for greater than 1 week history of sinus pressure, nasal congestion, productive cough.  He says over the past couple days the sputum has changed to a yellowish-green color.  He reports wheezing mostly in the mornings.  Denies fever, fatigue, chest pain, sore throat.  Does not use any inhalers.  Has been using cough drops which he says has helped.  His wife is sick with similar symptoms.  No concern for COVID.  He would like an antibiotic.  HPI  Past Medical History:  Diagnosis Date   Anemia    h/o   Anxiety    Arthritis    hands   Chronic kidney disease    kidney stone    Colon polyp 2016   COPD (chronic obstructive pulmonary disease) (HCC)    no inhalers   Depression    Diabetes mellitus without complication (HCC)    Hemorrhoid    Hyperlipidemia    Sleep apnea    Uses C-Pap machine     Patient Active Problem List   Diagnosis Date Noted   Recurrent major depressive disorder, in full remission (HCC) 08/11/2017   Perennial allergic rhinitis 08/11/2017   Sinusitis, chronic 06/18/2015   Hyperlipidemia 11/22/2014   Dyslipidemia associated with type 2 diabetes mellitus (HCC) 09/22/2014   Adiposity 09/22/2014   Obstructive sleep apnea 09/22/2014   Compulsive tobacco user syndrome 09/22/2014    Past Surgical History:  Procedure Laterality Date   COLONOSCOPY  2016   LIPOMA EXCISION  1984   NASAL FRACTURE SURGERY  1988   UMBILICAL HERNIA REPAIR N/A 04/15/2015   Procedure: HERNIA REPAIR UMBILICAL ADULT;  Surgeon: Kieth Brightly, MD;  Location: [facility] OR[facility];  Service: General;  Laterality: N/A;       Home Medications    Prior to  Admission medications   Medication Sig Start Date End Date Taking? Authorizing Provider  amoxicillin-clavulanate (AUGMENTIN) 875-125 MG tablet Take 1 tablet by mouth every 12 (twelve) hours for 7 days. 03/20/23 03/27/23 Yes Shirlee Latch, PA-C  citalopram (CELEXA) 20 MG tablet Take 1 tablet (20 mg total) by mouth daily. 08/11/17  Yes Sowles, Danna Hefty, MD  fluticasone (FLONASE) 50 MCG/ACT nasal spray Place 2 sprays into both nostrils daily. 08/11/17  Yes Sowles, Danna Hefty, MD  metFORMIN (GLUCOPHAGE-XR) 750 MG 24 hr tablet Take 1 tablet (750 mg total) by mouth daily with breakfast. 08/11/17  Yes Sowles, Danna Hefty, MD  rosuvastatin (CRESTOR) 20 MG tablet Take 1 tablet (20 mg total) by mouth at bedtime. 08/11/17  Yes Alba Cory, MD  aspirin EC 81 MG tablet Take 1 tablet (81 mg total) by mouth daily. 08/11/17   Alba Cory, MD  glipiZIDE (GLUCOTROL XL) 10 MG 24 hr tablet Take 10 mg by mouth daily.    [provider]  glucose blood test strip 1 strip 2 (two) times daily. Use as directed to check BG BID 04/14/13   Ellyn Hack, MD  hydrOXYzine (ATARAX/VISTARIL) 10 MG tablet Take 1 tablet (10 mg total) by mouth 3 (three) times daily as needed. 08/11/17   Alba Cory, MD  ibuprofen (ADVIL,MOTRIN) 200 MG tablet Take 200  mg by mouth every 6 (six) hours as needed.    [provider]    Family History Family History  Problem Relation Age of Onset   Hypertension Mother    Diabetes Mother    Hypertension Father    Diabetes Brother    Diabetes Maternal Grandmother     Social History Social History   Tobacco Use   Smoking status: Every Day    Current packs/day: 0.50    Average packs/day: 0.5 packs/day for 45.0 years (22.5 ttl pk-yrs)    Types: Cigarettes   Smokeless tobacco: Former  Building services engineer status: Never Used  Substance Use Topics   Alcohol use: Yes    Alcohol/week: 0.0 standard drinks of alcohol    Comment: occasionally   Drug use: No     Allergies    Codeine and Sulfa antibiotics   Review of Systems Review of Systems  Constitutional:  Positive for fatigue. Negative for fever.  HENT:  Positive for congestion, rhinorrhea and sinus pressure. Negative for sinus pain and sore throat.   Respiratory:  Positive for cough, shortness of breath and wheezing.   Gastrointestinal:  Negative for abdominal pain, diarrhea, nausea and vomiting.  Musculoskeletal:  Negative for myalgias.  Neurological:  Negative for weakness, light-headedness and headaches.  Hematological:  Negative for adenopathy.     Physical Exam Triage Vital Signs ED Triage Vitals  Encounter Vitals Group     BP      Systolic BP Percentile      Diastolic BP Percentile      Pulse      Resp      Temp      Temp src      SpO2      Weight      Height      Head Circumference      Peak Flow      Pain Score      Pain Loc      Pain Education      Exclude from Growth Chart    No data found.  Updated Vital Signs BP (!) 155/81 (BP Location: Left Arm)   Pulse 72   Temp 98.5 F (36.9 C) (Oral)   Resp 15   Ht 5\' 8"  (1.727 m)   Wt 202 lb 9.6 oz (91.9 kg)   SpO2 97%   BMI 30.81 kg/m     Physical Exam Vitals and nursing note reviewed.  Constitutional:      General: He is not in acute distress.    Appearance: Normal appearance. He is well-developed. He is not ill-appearing.  HENT:     Head: Normocephalic and atraumatic.     Nose: Congestion present.     Mouth/Throat:     Mouth: Mucous membranes are moist.     Pharynx: Oropharynx is clear.  Eyes:     General: No scleral icterus.    Conjunctiva/sclera: Conjunctivae normal.  Cardiovascular:     Rate and Rhythm: Normal rate and regular rhythm.     Heart sounds: Normal heart sounds.  Pulmonary:     Effort: Pulmonary effort is normal. No respiratory distress.     Breath sounds: Wheezing (few scattered wheezes which clear with cough) present.  Musculoskeletal:     Cervical back: Neck supple.  Skin:    General:  Skin is warm and dry.     Capillary Refill: Capillary refill takes less than 2 seconds.  Neurological:     General: No focal deficit  present.     Mental Status: He is alert. Mental status is at baseline.     Motor: No weakness.     Gait: Gait normal.  Psychiatric:        Mood and Affect: Mood normal.        Behavior: Behavior normal.      UC Treatments / Results  Labs (all labs ordered are listed, but only abnormal results are displayed) Labs Reviewed - No data to display  EKG   Radiology No results found.  Procedures Procedures (including critical care time)  Medications Ordered in UC Medications - No data to display  Initial Impression / Assessment and Plan / UC Course  I have reviewed the triage vital signs and the nursing notes.  Pertinent labs & imaging results that were available during my care of the patient were reviewed by me and considered in my medical decision making (see chart for details).   63 year old male presents for greater than 1 week history of congestion, productive cough, wheezing and shortness of breath.  Has history of COPD and continued tobacco abuse.  Does not use any inhalers.  Has been using OTC products.  Wife also sick with similar symptoms.  He is presently afebrile and overall well-appearing.  No acute distress.  Mild nasal congestion.  There are a couple of wheezes which clear with cough.  COPD exacerbation given change in sputum.  Will cover with antibiotics.  Sent Augmentin to pharmacy.  Also offered albuterol inhaler, Promethazine DM and prednisone but he declines and states he only wants antibiotic.  Advised to continue OTC cough medicine, increasing rest and fluids.  Reviewed return precautions.  Work note given.   Final Clinical Impressions(s) / UC Diagnoses   Final diagnoses:  COPD exacerbation (HCC)  Acute cough     Discharge Instructions      -Your COPD is flared up.  I sent an antibiotic to the pharmacy.  Continue cough  medication. - If you have fever, increased shortness of breath, chest pain, weakness please return for evaluation.     ED Prescriptions     Medication Sig Dispense Auth. Provider   amoxicillin-clavulanate (AUGMENTIN) 875-125 MG tablet Take 1 tablet by mouth every 12 (twelve) hours for 7 days. 14 tablet Gareth Morgan      PDMP not reviewed this encounter.   Shirlee Latch, PA-C 03/20/23 1155

## 2023-03-20 NOTE — Discharge Instructions (Addendum)
-  Your COPD is flared up.  I sent an antibiotic to the pharmacy.  Continue cough medication. - If you have fever, increased shortness of breath, chest pain, weakness please return for evaluation.

## 2023-03-31 ENCOUNTER — Encounter: Payer: Self-pay | Admitting: Emergency Medicine

## 2023-03-31 ENCOUNTER — Ambulatory Visit
Admission: EM | Admit: 2023-03-31 | Discharge: 2023-03-31 | Disposition: A | Discharge status: Home / Self Care | Payer: 59 | Attending: Physician Assistant | Admitting: Physician Assistant

## 2023-03-31 ENCOUNTER — Ambulatory Visit (INDEPENDENT_AMBULATORY_CARE_PROVIDER_SITE_OTHER): Payer: 59

## 2023-03-31 DIAGNOSIS — R058 Other specified cough: Secondary | ICD-10-CM | POA: Diagnosis not present

## 2023-03-31 DIAGNOSIS — J441 Chronic obstructive pulmonary disease with (acute) exacerbation: Secondary | ICD-10-CM | POA: Diagnosis not present

## 2023-03-31 MED ORDER — PREDNISONE 20 MG PO TABS
20.0000 mg | ORAL_TABLET | Freq: Every day | ORAL | 0 refills | Status: AC
Start: 2023-03-31 — End: 2023-04-05

## 2023-03-31 MED ORDER — ALBUTEROL SULFATE HFA 108 (90 BASE) MCG/ACT IN AERS
2.0000 | INHALATION_SPRAY | Freq: Once | RESPIRATORY_TRACT | Status: AC
  Administered 2023-03-31: 2 via RESPIRATORY_TRACT

## 2023-03-31 MED ORDER — DOXYCYCLINE HYCLATE 100 MG PO CAPS
100.0000 mg | ORAL_CAPSULE | Freq: Two times a day (BID) | ORAL | 0 refills | Status: DC
Start: 2023-03-31 — End: 2023-09-04

## 2023-03-31 MED ORDER — AEROCHAMBER PLUS FLO-VU LARGE MISC
1.0000 | Freq: Once | Status: AC
  Administered 2023-03-31: 1

## 2023-03-31 MED ORDER — SPIRIVA RESPIMAT 2.5 MCG/ACT IN AERS: 2.0000 | INHALATION_SPRAY | Freq: Every day | RESPIRATORY_TRACT | 0 refills | Status: DC

## 2023-03-31 NOTE — ED Triage Notes (Signed)
Pt c/o productive cough,fatigue, chest congestion and SOB for 2 weeks. Pt was seen 9/28 and states he improved just a little.

## 2023-03-31 NOTE — Discharge Instructions (Signed)
I will contact you if your x-ray shows something that I am asked and I need to change your treatment plan.  Start doxycycline 100 mg twice daily for 7 days.  Stay out of the sun while on this medication.  Use albuterol every 4-6 hours as needed for shortness of breath.  I have also called in another inhaler that should be used every day to help prevent breathing issues.  Take prednisone 20 mg for 5 days.  Do not take NSAIDs with this medication including aspirin, ibuprofen/Advil, naproxen/Aleve.  You can use Mucinex, Flonase, Tylenol.  Make sure you rest and drink plenty of fluid.  Follow-up with your primary care next week.  If anything worsens please return for reevaluation.

## 2023-03-31 NOTE — ED Provider Notes (Signed)
[facility]    CSN: [id number] Arrival date & time: 03/31/23  4098      History   Chief Complaint Chief Complaint  Patient presents with   Cough   Shortness of Breath    HPI Trevor Young is a 62 y.o. male.   Patient presents today with a 3-week history of URI symptoms.  He was seen by clinic on 03/20/2023 at which point he was started on Augmentin.  He reports that he had a slight improvement in his symptoms but continues to have significant cough, shortness of breath, wheezing.  Denies any significant sore throat, congestion, nausea, vomiting, fever.  He has a chronic smoker and reports smoking his typical amount.  He has been using fluticasone nasal spray but no additional over-the-counter medications for symptom management.  Denies additional antibiotics or steroids in the past 90 days.  He does have a history of COPD but does not take any maintenance or rescue medication on a regular basis.  Denies hospitalization related to chronic lung condition.  He does have a history of diabetes but his blood sugars are well-controlled with his last A1c 7.6% 02/09/2023.  He is having difficulty with his daily activities as a result of symptoms.    Past Medical History:  Diagnosis Date   Anemia    h/o   Anxiety    Arthritis    hands   Chronic kidney disease    kidney stone    Colon polyp 2016   COPD (chronic obstructive pulmonary disease) (HCC)    no inhalers   Depression    Diabetes mellitus without complication (HCC)    Hemorrhoid    Hyperlipidemia    Sleep apnea    Uses C-Pap machine     Patient Active Problem List   Diagnosis Date Noted   Recurrent major depressive disorder, in full remission (HCC) 08/11/2017   Perennial allergic rhinitis 08/11/2017   Sinusitis, chronic 06/18/2015   Hyperlipidemia 11/22/2014   Dyslipidemia associated with type 2 diabetes mellitus (HCC) 09/22/2014   Adiposity 09/22/2014   Obstructive sleep apnea 09/22/2014   Compulsive  tobacco user syndrome 09/22/2014    Past Surgical History:  Procedure Laterality Date   COLONOSCOPY  2016   LIPOMA EXCISION  1984   NASAL FRACTURE SURGERY  1988   UMBILICAL HERNIA REPAIR N/A 04/15/2015   Procedure: HERNIA REPAIR UMBILICAL ADULT;  Surgeon: Kieth Brightly, MD;  Location: [facility] OR[facility];  Service: General;  Laterality: N/A;       Home Medications    Prior to Admission medications   Medication Sig Start Date End Date Taking? Authorizing Provider  doxycycline (VIBRAMYCIN) 100 MG capsule Take 1 capsule (100 mg total) by mouth 2 (two) times daily. 03/31/23  Yes Elianna Windom K, PA-C  predniSONE (DELTASONE) 20 MG tablet Take 1 tablet (20 mg total) by mouth daily for 5 days. 03/31/23 04/05/23 Yes Sharmila Wrobleski, Noberto Retort, PA-C  Tiotropium Bromide Monohydrate (SPIRIVA RESPIMAT) 2.5 MCG/ACT AERS Inhale 2 puffs into the lungs daily. 03/31/23  Yes Burak Zerbe, Noberto Retort, PA-C  aspirin EC 81 MG tablet Take 1 tablet (81 mg total) by mouth daily. 08/11/17   Alba Cory, MD  citalopram (CELEXA) 20 MG tablet Take 1 tablet (20 mg total) by mouth daily. 08/11/17   Alba Cory, MD  fluticasone (FLONASE) 50 MCG/ACT nasal spray Place 2 sprays into both nostrils daily. 08/11/17   Alba Cory, MD  glipiZIDE (GLUCOTROL XL) 10 MG 24 hr tablet Take 10 mg by mouth  daily.    [provider]  glucose blood test strip 1 strip 2 (two) times daily. Use as directed to check BG BID 04/14/13   Ellyn Hack, MD  hydrOXYzine (ATARAX/VISTARIL) 10 MG tablet Take 1 tablet (10 mg total) by mouth 3 (three) times daily as needed. 08/11/17   Alba Cory, MD  ibuprofen (ADVIL,MOTRIN) 200 MG tablet Take 200 mg by mouth every 6 (six) hours as needed.    [provider]  metFORMIN (GLUCOPHAGE-XR) 750 MG 24 hr tablet Take 1 tablet (750 mg total) by mouth daily with breakfast. 08/11/17   Alba Cory, MD  rosuvastatin (CRESTOR) 20 MG tablet Take 1 tablet (20 mg total) by mouth at bedtime. 08/11/17    Alba Cory, MD    Family History Family History  Problem Relation Age of Onset   Hypertension Mother    Diabetes Mother    Hypertension Father    Diabetes Brother    Diabetes Maternal Grandmother     Social History Social History   Tobacco Use   Smoking status: Every Day    Current packs/day: 0.50    Average packs/day: 0.5 packs/day for 45.0 years (22.5 ttl pk-yrs)    Types: Cigarettes   Smokeless tobacco: Former  Building services engineer status: Never Used  Substance Use Topics   Alcohol use: Yes    Alcohol/week: 0.0 standard drinks of alcohol    Comment: occasionally   Drug use: No     Allergies   Codeine, Other, and Sulfa antibiotics   Review of Systems Review of Systems  Constitutional:  Positive for activity change. Negative for appetite change, fatigue and fever.  HENT:  Negative for congestion, sinus pressure, sneezing and sore throat.   Respiratory:  Positive for cough, chest tightness and shortness of breath. Negative for wheezing.   Cardiovascular:  Negative for chest pain.  Gastrointestinal:  Negative for abdominal pain, diarrhea, nausea and vomiting.     Physical Exam Triage Vital Signs ED Triage Vitals  Encounter Vitals Group     BP 03/31/23 0951 126/84     Systolic BP Percentile --      Diastolic BP Percentile --      Pulse Rate 03/31/23 0951 73     Resp 03/31/23 0951 16     Temp 03/31/23 0951 98.3 F (36.8 C)     Temp Source 03/31/23 0951 Oral     SpO2 03/31/23 0951 96 %     Weight --      Height --      Head Circumference --      Peak Flow --      Pain Score 03/31/23 0950 0     Pain Loc --      Pain Education --      Exclude from Growth Chart --    No data found.  Updated Vital Signs BP 126/84 (BP Location: Left Arm)   Pulse 73   Temp 98.3 F (36.8 C) (Oral)   Resp 16   SpO2 96%   Visual Acuity Right Eye Distance:   Left Eye Distance:   Bilateral Distance:    Right Eye Near:   Left Eye Near:    Bilateral Near:      Physical Exam Vitals reviewed.  Constitutional:      General: He is awake.     Appearance: Normal appearance. He is well-developed. He is not ill-appearing.     Comments: Very pleasant male appears stated age in no  acute distress sitting comfortably in exam room  HENT:     Head: Normocephalic and atraumatic.     Right Ear: Tympanic membrane, ear canal and external ear normal. Tympanic membrane is not erythematous or bulging.     Left Ear: Tympanic membrane, ear canal and external ear normal. Tympanic membrane is not erythematous or bulging.     Nose: Nose normal.     Mouth/Throat:     Dentition: Abnormal dentition.     Pharynx: Uvula midline. No oropharyngeal exudate or posterior oropharyngeal erythema.  Cardiovascular:     Rate and Rhythm: Normal rate and regular rhythm.     Heart sounds: Normal heart sounds, S1 normal and S2 normal. No murmur heard. Pulmonary:     Effort: Pulmonary effort is normal. No accessory muscle usage or respiratory distress.     Breath sounds: No stridor. Examination of the right-lower field reveals decreased breath sounds. Examination of the left-lower field reveals decreased breath sounds. Decreased breath sounds and wheezing present. No rhonchi or rales.  Neurological:     Mental Status: He is alert.  Psychiatric:        Behavior: Behavior is cooperative.      UC Treatments / Results  Labs (all labs ordered are listed, but only abnormal results are displayed) Labs Reviewed - No data to display  EKG   Radiology DG Chest 2 View  Result Date: 03/31/2023 CLINICAL DATA:  Worsening productive cough with fatigue, chest congestion and shortness of breath for 2 weeks. History of COPD. EXAM: CHEST - 2 VIEW COMPARISON:  Radiographs 09/07/2016 and 07/18/2013. FINDINGS: PA view was repeated. The heart size and mediastinal contours are stable. The lungs are hyperinflated with chronic central airway thickening. No superimposed airspace disease, edema, pleural  effusion or pneumothorax. The bones appear unremarkable. IMPRESSION: Chronic obstructive pulmonary disease. No evidence of acute superimposed process. Electronically Signed   By: Carey Bullocks M.D.   On: 03/31/2023 11:40    Procedures Procedures (including critical care time)  Medications Ordered in UC Medications  albuterol (VENTOLIN HFA) 108 (90 Base) MCG/ACT inhaler 2 puff (2 puffs Inhalation Given 03/31/23 1020)  AeroChamber Plus Flo-Vu Large MISC 1 each (1 each Other Given 03/31/23 1020)    Initial Impression / Assessment and Plan / UC Course  I have reviewed the triage vital signs and the nursing notes.  Pertinent labs & imaging results that were available during my care of the patient were reviewed by me and considered in my medical decision making (see chart for details).     Patient is well-appearing, afebrile, nontoxic, nontachycardic.  Chest x-ray was obtained given his worsening symptoms that showed chronic changes without acute cardiopulmonary disease.  He was given albuterol in clinic with improvement of symptoms.  He was sent home with this medication to use it every 4-6 hours as needed.  I am concerned for COPD exacerbation given his clinical presentation.  He was recently treated with Augmentin so we will use doxycycline and he was instructed not to be in the sun for prolonged periods of time while on this medication due to associated photosensitivity.  Will start prednisone burst of 20 mg for 5 days.  We discussed that this can raise his blood sugar so he should drink plenty of fluid and avoid carbohydrates.  He is to avoid NSAIDs while on prednisone.  Discussed that if his blood sugars are persistently elevated or develops any concerning symptoms he should be reevaluated.  He was given prescription for Spiriva  to help prevent recurrent symptoms.  Can use over-the-counter medication including Mucinex, Flonase, Tylenol.  Recommended that he rest and drink plenty of fluid.  If his  symptoms are not improving within a few days or if anything worsens he needs to return for reevaluation.  Strict return precautions given.  Work excuse note provided.  Final Clinical Impressions(s) / UC Diagnoses   Final diagnoses:  COPD exacerbation (HCC)  Productive cough     Discharge Instructions      I will contact you if your x-ray shows something that I am asked and I need to change your treatment plan.  Start doxycycline 100 mg twice daily for 7 days.  Stay out of the sun while on this medication.  Use albuterol every 4-6 hours as needed for shortness of breath.  I have also called in another inhaler that should be used every day to help prevent breathing issues.  Take prednisone 20 mg for 5 days.  Do not take NSAIDs with this medication including aspirin, ibuprofen/Advil, naproxen/Aleve.  You can use Mucinex, Flonase, Tylenol.  Make sure you rest and drink plenty of fluid.  Follow-up with your primary care next week.  If anything worsens please return for reevaluation.     ED Prescriptions     Medication Sig Dispense Auth. Provider   Tiotropium Bromide Monohydrate (SPIRIVA RESPIMAT) 2.5 MCG/ACT AERS Inhale 2 puffs into the lungs daily. 4 g Draycen Leichter K, PA-C   predniSONE (DELTASONE) 20 MG tablet Take 1 tablet (20 mg total) by mouth daily for 5 days. 5 tablet Ramesha Poster K, PA-C   doxycycline (VIBRAMYCIN) 100 MG capsule Take 1 capsule (100 mg total) by mouth 2 (two) times daily. 14 capsule Elex Mainwaring K, PA-C      PDMP not reviewed this encounter.   Jeani Hawking, PA-C 03/31/23 1145

## 2023-08-13 ENCOUNTER — Encounter: Payer: Self-pay | Admitting: Gastroenterology

## 2023-08-20 ENCOUNTER — Ambulatory Visit
Admission: RE | Admit: 2023-08-20 | Discharge: 2023-08-20 | Disposition: A | Discharge status: Home / Self Care | Payer: 59 | Attending: Gastroenterology | Admitting: Gastroenterology

## 2023-08-20 ENCOUNTER — Ambulatory Visit: Payer: 59 | Admitting: Registered Nurse

## 2023-08-20 ENCOUNTER — Other Ambulatory Visit: Payer: Self-pay

## 2023-08-20 ENCOUNTER — Encounter: Payer: Self-pay | Admitting: Gastroenterology

## 2023-08-20 ENCOUNTER — Encounter
Admission: RE | Disposition: A | Discharge status: Home / Self Care | Payer: Self-pay | Source: Home / Self Care | Attending: Gastroenterology

## 2023-08-20 DIAGNOSIS — D122 Benign neoplasm of ascending colon: Secondary | ICD-10-CM | POA: Diagnosis not present

## 2023-08-20 DIAGNOSIS — E1122 Type 2 diabetes mellitus with diabetic chronic kidney disease: Secondary | ICD-10-CM | POA: Diagnosis not present

## 2023-08-20 DIAGNOSIS — N189 Chronic kidney disease, unspecified: Secondary | ICD-10-CM | POA: Insufficient documentation

## 2023-08-20 DIAGNOSIS — K644 Residual hemorrhoidal skin tags: Secondary | ICD-10-CM | POA: Diagnosis not present

## 2023-08-20 DIAGNOSIS — Z7984 Long term (current) use of oral hypoglycemic drugs: Secondary | ICD-10-CM | POA: Insufficient documentation

## 2023-08-20 DIAGNOSIS — Z1211 Encounter for screening for malignant neoplasm of colon: Secondary | ICD-10-CM | POA: Diagnosis present

## 2023-08-20 DIAGNOSIS — J449 Chronic obstructive pulmonary disease, unspecified: Secondary | ICD-10-CM | POA: Diagnosis not present

## 2023-08-20 DIAGNOSIS — K648 Other hemorrhoids: Secondary | ICD-10-CM | POA: Insufficient documentation

## 2023-08-20 DIAGNOSIS — D128 Benign neoplasm of rectum: Secondary | ICD-10-CM | POA: Diagnosis not present

## 2023-08-20 DIAGNOSIS — G4730 Sleep apnea, unspecified: Secondary | ICD-10-CM | POA: Insufficient documentation

## 2023-08-20 DIAGNOSIS — F17210 Nicotine dependence, cigarettes, uncomplicated: Secondary | ICD-10-CM | POA: Diagnosis not present

## 2023-08-20 DIAGNOSIS — D123 Benign neoplasm of transverse colon: Secondary | ICD-10-CM | POA: Insufficient documentation

## 2023-08-20 HISTORY — PX: COLONOSCOPY: SHX5424

## 2023-08-20 HISTORY — PX: POLYPECTOMY: SHX5525

## 2023-08-20 LAB — GLUCOSE, CAPILLARY: Glucose-Capillary: 157 mg/dL — ABNORMAL HIGH (ref 70–99)

## 2023-08-20 SURGERY — COLONOSCOPY
Anesthesia: General

## 2023-08-20 MED ORDER — LIDOCAINE HCL (CARDIAC) PF 100 MG/5ML IV SOSY
PREFILLED_SYRINGE | INTRAVENOUS | Status: DC | PRN
  Administered 2023-08-20: 60 mg via INTRAVENOUS

## 2023-08-20 MED ORDER — SODIUM CHLORIDE 0.9 % IV SOLN: INTRAVENOUS | Status: DC

## 2023-08-20 MED ORDER — PROPOFOL 500 MG/50ML IV EMUL
INTRAVENOUS | Status: DC | PRN
  Administered 2023-08-20: 100 ug/kg/min via INTRAVENOUS
  Administered 2023-08-20: 50 mg via INTRAVENOUS

## 2023-08-20 MED ORDER — DEXMEDETOMIDINE HCL IN NACL 80 MCG/20ML IV SOLN
INTRAVENOUS | Status: DC | PRN
Start: 2023-08-20 — End: 2023-08-20
  Administered 2023-08-20: 8 ug via INTRAVENOUS

## 2023-08-20 NOTE — H&P (Signed)
 Pre-Procedure H&P   Patient ID: Trevor Young is a 63 y.o. male.  Gastroenterology Provider: Jaynie Collins, DO  Referring Provider: Evalee Jefferson, NP PCP: Alba Cory, MD  Date: 08/20/2023  HPI Trevor Young is a 63 y.o. male who presents today for Colonoscopy for personal hx of colon polyps.  Bowel movements daily without melena or hematochezia  Patient's last colonoscopy was in May 2017 demonstrating 6 total polyps-tubular adenomas and sessile serrated.  Hypertrophied anal papilla and internal hemorrhoids were noted  Creatinine 1.1   Past Medical History:  Diagnosis Date   Anemia    h/o   Anxiety    Arthritis    hands   Chronic kidney disease    kidney stone    Colon polyp 2016   COPD (chronic obstructive pulmonary disease) (HCC)    no inhalers   Depression    Diabetes mellitus without complication (HCC)    Hemorrhoid    Hyperlipidemia    Sleep apnea    Uses C-Pap machine     Past Surgical History:  Procedure Laterality Date   COLONOSCOPY  2016   LIPOMA EXCISION  1984   NASAL FRACTURE SURGERY  1988   UMBILICAL HERNIA REPAIR N/A 04/15/2015   Procedure: HERNIA REPAIR UMBILICAL ADULT;  Surgeon: Kieth Brightly, MD;  Location: [facility] ORS;  Service: General;  Laterality: N/A;    Family History No h/o GI disease or malignancy  Review of Systems  Constitutional:  Negative for activity change, appetite change, chills, diaphoresis, fatigue, fever and unexpected weight change.  HENT:  Negative for trouble swallowing and voice change.   Respiratory:  Negative for shortness of breath and wheezing.   Cardiovascular:  Negative for chest pain, palpitations and leg swelling.  Gastrointestinal:  Negative for abdominal distention, abdominal pain, anal bleeding, blood in stool, constipation, diarrhea, nausea and vomiting.  Musculoskeletal:  Negative for arthralgias and myalgias.  Skin:  Negative for color change and pallor.  Neurological:  Negative for  dizziness, syncope and weakness.  Psychiatric/Behavioral:  Negative for confusion. The patient is not nervous/anxious.   All other systems reviewed and are negative.    Medications No current facility-administered medications on file prior to encounter.   Current Outpatient Medications on File Prior to Encounter  Medication Sig Dispense Refill   aspirin EC 81 MG tablet Take 1 tablet (81 mg total) by mouth daily. 30 tablet 0   citalopram (CELEXA) 20 MG tablet Take 1 tablet (20 mg total) by mouth daily. 90 tablet 1   doxycycline (VIBRAMYCIN) 100 MG capsule Take 1 capsule (100 mg total) by mouth 2 (two) times daily. 14 capsule 0   fluticasone (FLONASE) 50 MCG/ACT nasal spray Place 2 sprays into both nostrils daily. 16 g 2   glipiZIDE (GLUCOTROL XL) 10 MG 24 hr tablet Take 10 mg by mouth daily.     ibuprofen (ADVIL,MOTRIN) 200 MG tablet Take 200 mg by mouth every 6 (six) hours as needed.     metFORMIN (GLUCOPHAGE-XR) 750 MG 24 hr tablet Take 1 tablet (750 mg total) by mouth daily with breakfast. 180 tablet 1   rosuvastatin (CRESTOR) 20 MG tablet Take 1 tablet (20 mg total) by mouth at bedtime. 90 tablet 1   Tiotropium Bromide Monohydrate (SPIRIVA RESPIMAT) 2.5 MCG/ACT AERS Inhale 2 puffs into the lungs daily. 4 g 0   glucose blood test strip 1 strip 2 (two) times daily. Use as directed to check BG BID     hydrOXYzine (ATARAX/VISTARIL) 10  MG tablet Take 1 tablet (10 mg total) by mouth 3 (three) times daily as needed. 30 tablet 0    Pertinent medications related to GI and procedure were reviewed by me with the patient prior to the procedure   Current Facility-Administered Medications:    0.9 %  sodium chloride infusion, , Intravenous, Continuous, Jaynie Collins, DO  sodium chloride         Allergies  Allergen Reactions   Codeine Nausea And Vomiting   Other Other (See Comments)    Leg cramps   Sulfa Antibiotics    Allergies were reviewed by me prior to the  procedure  Objective   Body mass index is 30.38 kg/m. Vitals:   08/20/23 0740  BP: 132/79  Pulse: (!) 55  Resp: 18  Temp: (!) 97.4 F (36.3 C)  TempSrc: Temporal  SpO2: 98%  Weight: 90.6 kg  Height: 5\' 8"  (1.727 m)     Physical Exam Vitals and nursing note reviewed.  Constitutional:      General: He is not in acute distress.    Appearance: Normal appearance. He is not ill-appearing, toxic-appearing or diaphoretic.  HENT:     Head: Normocephalic and atraumatic.     Nose: Nose normal.     Mouth/Throat:     Mouth: Mucous membranes are moist.     Pharynx: Oropharynx is clear.  Eyes:     General: No scleral icterus.    Extraocular Movements: Extraocular movements intact.  Cardiovascular:     Rate and Rhythm: Regular rhythm. Bradycardia present.     Heart sounds: Normal heart sounds. No murmur heard.    No friction rub. No gallop.  Pulmonary:     Effort: Pulmonary effort is normal. No respiratory distress.     Breath sounds: Normal breath sounds. No wheezing, rhonchi or rales.  Abdominal:     General: Bowel sounds are normal. There is no distension.     Palpations: Abdomen is soft.     Tenderness: There is no abdominal tenderness. There is no guarding or rebound.  Musculoskeletal:     Cervical back: Neck supple.     Right lower leg: No edema.     Left lower leg: No edema.  Skin:    General: Skin is warm and dry.     Coloration: Skin is not jaundiced or pale.  Neurological:     General: No focal deficit present.     Mental Status: He is alert and oriented to person, place, and time. Mental status is at baseline.  Psychiatric:        Mood and Affect: Mood normal.        Behavior: Behavior normal.        Thought Content: Thought content normal.        Judgment: Judgment normal.      Assessment:  Trevor Young is a 63 y.o. male  who presents today for Colonoscopy for personal hx of colon polyps.  Plan:  Colonoscopy with possible intervention  today  Colonoscopy with possible biopsy, control of bleeding, polypectomy, and interventions as necessary has been discussed with the patient/patient representative. Informed consent was obtained from the patient/patient representative after explaining the indication, nature, and risks of the procedure including but not limited to death, bleeding, perforation, missed neoplasm/lesions, cardiorespiratory compromise, and reaction to medications. Opportunity for questions was given and appropriate answers were provided. Patient/patient representative has verbalized understanding is amenable to undergoing the procedure.   Jaynie Collins, DO  [facility]astro[facility]  Portions of the record may have been created with voice recognition software. Occasional wrong-word or 'sound-a-like' substitutions may have occurred due to the inherent limitations of voice recognition software.  Read the chart carefully and recognize, using context, where substitutions may have occurred.

## 2023-08-20 NOTE — Op Note (Signed)
 [facility] Gastroenterology Patient Name: Trevor Young Procedure Date: 08/20/2023 7:58 AM MRN: [id number] Account #: [id number] Date of Birth: 02/20/1961 Admit Type: Outpatient Age: 63 Room: [facility] ENDO ROOM 1 Gender: Male Note Status: Finalized Instrument Name: Colonoscope [id number] Procedure:             Colonoscopy Indications:           High risk colon cancer surveillance: Personal history                         of colonic polyps Providers:             Trenda Moots, DO Referring MD:          Onnie Boer. Sowles, MD (Referring MD) Medicines:             Monitored Anesthesia Care Complications:         No immediate complications. Estimated blood loss:                         Minimal. Procedure:             Pre-Anesthesia Assessment:                        - Prior to the procedure, a History and Physical was                         performed, and patient medications and allergies were                         reviewed. The patient is competent. The risks and                         benefits of the procedure and the sedation options and                         risks were discussed with the patient. All questions                         were answered and informed consent was obtained.                         Patient identification and proposed procedure were                         verified by the physician, the nurse, the anesthetist                         and the technician in the endoscopy suite. Mental                         Status Examination: alert and oriented. Airway                         Examination: normal oropharyngeal airway and neck                         mobility. Respiratory Examination: clear to  auscultation. CV Examination: RRR, no murmurs, no S3                         or S4. Prophylactic Antibiotics: The patient does not                         require prophylactic antibiotics. Prior                          Anticoagulants: The patient has taken no anticoagulant                         or antiplatelet agents. ASA Grade Assessment: III - A                         patient with severe systemic disease. After reviewing                         the risks and benefits, the patient was deemed in                         satisfactory condition to undergo the procedure. The                         anesthesia plan was to use monitored anesthesia care                         (MAC). Immediately prior to administration of                         medications, the patient was re-assessed for adequacy                         to receive sedatives. The heart rate, respiratory                         rate, oxygen saturations, blood pressure, adequacy of                         pulmonary ventilation, and response to care were                         monitored throughout the procedure. The physical                         status of the patient was re-assessed after the                         procedure.                        After obtaining informed consent, the colonoscope was                         passed under direct vision. Throughout the procedure,                         the patient's blood pressure, pulse, and oxygen  saturations were monitored continuously. The                         Colonoscope was introduced through the anus and                         advanced to the the cecum, identified by appendiceal                         orifice and ileocecal valve. The colonoscopy was                         performed without difficulty. The patient tolerated                         the procedure well. The quality of the bowel                         preparation was evaluated using the BBPS [facility] Bowel                         Preparation Scale) with scores of: Right Colon = 3                         (entire mucosa seen well with no residual staining,                         small  fragments of stool or opaque liquid), Transverse                         Colon = 3 (entire mucosa seen well with no residual                         staining, small fragments of stool or opaque liquid)                         and Left Colon = 2 (minor amount of residual staining,                         small fragments of stool and/or opaque liquid, but                         mucosa seen well). The total BBPS score equals 8. The                         quality of the bowel preparation was excellent. The                         ileocecal valve, appendiceal orifice, and rectum were                         photographed. Findings:      Hemorrhoids were found on perianal exam.      The digital rectal exam was normal. Pertinent negatives include normal       sphincter tone.      Four sessile polyps were found in the rectum (1), transverse colon (2)  and ascending colon (1). The polyps were 1 to 2 mm in size. These polyps       were removed with a jumbo cold forceps. Resection and retrieval were       complete. Estimated blood loss was minimal.      Anal papilla(e) were hypertrophied. Estimated blood loss: none.      Non-bleeding external and internal hemorrhoids were found during       retroflexion and during perianal exam. Estimated blood loss: none.      The exam was otherwise without abnormality on direct and retroflexion       views. Impression:            - Hemorrhoids found on perianal exam.                        - Four 1 to 2 mm polyps in the rectum, in the                         transverse colon and in the ascending colon, removed                         with a jumbo cold forceps. Resected and retrieved.                        - Anal papilla(e) were hypertrophied.                        - Non-bleeding external and internal hemorrhoids.                        - The examination was otherwise normal on direct and                         retroflexion views. Recommendation:         - Patient has a contact number available for                         emergencies. The signs and symptoms of potential                         delayed complications were discussed with the patient.                         Return to normal activities tomorrow. Written                         discharge instructions were provided to the patient.                        - Discharge patient to home.                        - Resume previous diet.                        - Continue present medications.                        - Await pathology results.                        -  Repeat colonoscopy for surveillance based on                         pathology results.                        - Return to referring physician as previously                         scheduled.                        - The findings and recommendations were discussed with                         the patient. Procedure Code(s):     --- Professional ---                        [id number], Colonoscopy, flexible; with biopsy, single or                         multiple Diagnosis Code(s):     --- Professional ---                        Z86.010, Personal history of colonic polyps                        D12.8, Benign neoplasm of rectum                        D12.3, Benign neoplasm of transverse colon (hepatic                         flexure or splenic flexure)                        D12.2, Benign neoplasm of ascending colon                        K62.89, Other specified diseases of anus and rectum                        K64.8, Other hemorrhoids CPT copyright 2022 American Medical Association. All rights reserved. The codes documented in this report are preliminary and upon coder review may  be revised to meet current compliance requirements. Attending Participation:      I personally performed the entire procedure. Elfredia Nevins, DO Jaynie Collins DO, DO 08/20/2023 8:42:38 AM This report has been signed electronically. Number of  Addenda: 0 Note Initiated On: 08/20/2023 7:58 AM Scope Withdrawal Time: 0 hours 11 minutes 59 seconds  Total Procedure Duration: 0 hours 19 minutes 30 seconds  Estimated Blood Loss:  Estimated blood loss was minimal.      [facility]

## 2023-08-20 NOTE — Anesthesia Postprocedure Evaluation (Signed)
 Anesthesia Post Note  Patient: ALMIR BOTTS  Procedure(s) Performed: COLONOSCOPY POLYPECTOMY  Patient location during evaluation: Endoscopy Anesthesia Type: General Level of consciousness: awake and alert Pain management: pain level controlled Vital Signs Assessment: post-procedure vital signs reviewed and stable Respiratory status: spontaneous breathing, nonlabored ventilation, respiratory function stable and patient connected to nasal cannula oxygen Cardiovascular status: blood pressure returned to baseline and stable Postop Assessment: no apparent nausea or vomiting Anesthetic complications: no   No notable events documented.   Last Vitals:  Vitals:   08/20/23 0843 08/20/23 0853  BP: 110/77 127/81  Pulse: 67 (!) 57  Resp: 16 18  Temp: (!) 36.2 C   SpO2: 97% 97%    Last Pain:  Vitals:   08/20/23 0853  TempSrc:   PainSc: 0-No pain                 Louie Boston

## 2023-08-20 NOTE — Anesthesia Procedure Notes (Signed)
 Procedure Name: General with mask airway Date/Time: 08/20/2023 8:12 AM  Performed by: Lily Lovings, CRNAPre-anesthesia Checklist: Patient identified, Emergency Drugs available, Suction available, Patient being monitored and Timeout performed Patient Re-evaluated:Patient Re-evaluated prior to induction Oxygen Delivery Method: Simple face mask Preoxygenation: Pre-oxygenation with 100% oxygen Comments: pom

## 2023-08-20 NOTE — Interval H&P Note (Signed)
 History and Physical Interval Note: Preprocedure H&P from 08/20/23  was reviewed and there was no interval change after seeing and examining the patient.  Written consent was obtained from the patient after discussion of risks, benefits, and alternatives. Patient has consented to proceed with Colonoscopy with possible intervention   08/20/2023 8:04 AM  Trevor Young  has presented today for surgery, with the diagnosis of Personal history of adenomatous and serrated colon polyps (Z86.0101).  The various methods of treatment have been discussed with the patient and family. After consideration of risks, benefits and other options for treatment, the patient has consented to  Procedure(s): COLONOSCOPY (N/A) as a surgical intervention.  The patient's history has been reviewed, patient examined, no change in status, stable for surgery.  I have reviewed the patient's chart and labs.  Questions were answered to the patient's satisfaction.     Trevor Young

## 2023-08-20 NOTE — Transfer of Care (Signed)
 Immediate Anesthesia Transfer of Care Note  Patient: Trevor Young  Procedure(s) Performed: COLONOSCOPY POLYPECTOMY  Patient Location: Endoscopy Unit  Anesthesia Type:General  Level of Consciousness: drowsy and patient cooperative  Airway & Oxygen Therapy: Patient Spontanous Breathing  Post-op Assessment: Report given to RN and Post -op Vital signs reviewed and stable  Post vital signs: Reviewed and stable  Last Vitals:  Vitals Value Taken Time  BP 110/77 08/20/23 0843  Temp 36.2 C 08/20/23 0843  Pulse 67 08/20/23 0843  Resp 16 08/20/23 0843  SpO2 98 % 08/20/23 0842    Last Pain:  Vitals:   08/20/23 0843  TempSrc: Tympanic  PainSc: 0-No pain         Complications: No notable events documented.

## 2023-08-20 NOTE — Anesthesia Preprocedure Evaluation (Addendum)
 Anesthesia Evaluation  Patient identified by MRN, date of birth, ID band Patient awake    Reviewed: Allergy & Precautions, NPO status , Patient's Chart, lab work & pertinent test results  History of Anesthesia Complications Negative for: history of anesthetic complications  Airway Mallampati: III  TM Distance: >3 FB Neck ROM: full    Dental  (+) Poor Dentition   Pulmonary sleep apnea and Continuous Positive Airway Pressure Ventilation , COPD, Current Smoker and Patient abstained from smoking.   Pulmonary exam normal        Cardiovascular negative cardio ROS Normal cardiovascular exam     Neuro/Psych  PSYCHIATRIC DISORDERS Anxiety Depression    negative neurological ROS     GI/Hepatic negative GI ROS, Neg liver ROS,,,  Endo/Other  diabetes    Renal/GU Renal disease  negative genitourinary   Musculoskeletal   Abdominal   Peds  Hematology  (+) Blood dyscrasia, anemia   Anesthesia Other Findings Past Medical History: No date: Anemia     Comment:  h/o No date: Anxiety No date: Arthritis     Comment:  hands No date: Chronic kidney disease     Comment:  kidney stone  2016: Colon polyp No date: COPD (chronic obstructive pulmonary disease) (HCC)     Comment:  no inhalers No date: Depression No date: Diabetes mellitus without complication (HCC) No date: Hemorrhoid No date: Hyperlipidemia No date: Sleep apnea     Comment:  Uses C-Pap machine   Past Surgical History: 2016: COLONOSCOPY 1984: LIPOMA EXCISION 1988: NASAL FRACTURE SURGERY 04/15/2015: UMBILICAL HERNIA REPAIR; N/A     Comment:  Procedure: HERNIA REPAIR UMBILICAL ADULT;  Surgeon:               Kieth Brightly, MD;  Location: [facility] ORS;  Service:              General;  Laterality: N/A;  BMI    Body Mass Index: 30.38 kg/m      Reproductive/Obstetrics negative OB ROS                             Anesthesia  Physical Anesthesia Plan  ASA: 3  Anesthesia Plan: General   Post-op Pain Management: Minimal or no pain anticipated   Induction: Intravenous  PONV Risk Score and Plan: 2 and Propofol infusion and TIVA  Airway Management Planned: Natural Airway and Nasal Cannula  Additional Equipment:   Intra-op Plan:   Post-operative Plan:   Informed Consent: I have reviewed the patients History and Physical, chart, labs and discussed the procedure including the risks, benefits and alternatives for the proposed anesthesia with the patient or authorized representative who has indicated his/her understanding and acceptance.     Dental Advisory Given  Plan Discussed with: Anesthesiologist, CRNA and Surgeon  Anesthesia Plan Comments: (Patient consented for risks of anesthesia including but not limited to:  - adverse reactions to medications - risk of airway placement if required - damage to eyes, teeth, lips or other oral mucosa - nerve damage due to positioning  - sore throat or hoarseness - Damage to heart, brain, nerves, lungs, other parts of body or loss of life  Patient voiced understanding and assent.)       Anesthesia Quick Evaluation

## 2023-08-23 LAB — SURGICAL PATHOLOGY

## 2023-09-04 ENCOUNTER — Ambulatory Visit (INDEPENDENT_AMBULATORY_CARE_PROVIDER_SITE_OTHER)

## 2023-09-04 ENCOUNTER — Ambulatory Visit
Admission: EM | Admit: 2023-09-04 | Discharge: 2023-09-04 | Disposition: A | Discharge status: Home / Self Care | Attending: Physician Assistant | Admitting: Physician Assistant

## 2023-09-04 ENCOUNTER — Encounter: Payer: Self-pay | Admitting: Emergency Medicine

## 2023-09-04 DIAGNOSIS — J441 Chronic obstructive pulmonary disease with (acute) exacerbation: Secondary | ICD-10-CM | POA: Diagnosis present

## 2023-09-04 DIAGNOSIS — R051 Acute cough: Secondary | ICD-10-CM

## 2023-09-04 DIAGNOSIS — J101 Influenza due to other identified influenza virus with other respiratory manifestations: Secondary | ICD-10-CM | POA: Diagnosis present

## 2023-09-04 DIAGNOSIS — J3089 Other allergic rhinitis: Secondary | ICD-10-CM | POA: Diagnosis not present

## 2023-09-04 LAB — RESP PANEL BY RT-PCR (FLU A&B, COVID) ARPGX2
Influenza A by PCR: POSITIVE — AB
Influenza B by PCR: NEGATIVE
SARS Coronavirus 2 by RT PCR: NEGATIVE

## 2023-09-04 MED ORDER — ALBUTEROL SULFATE HFA 108 (90 BASE) MCG/ACT IN AERS: 1.0000 | INHALATION_SPRAY | Freq: Four times a day (QID) | RESPIRATORY_TRACT | 0 refills | Status: AC | PRN

## 2023-09-04 MED ORDER — BUDESONIDE-FORMOTEROL FUMARATE 80-4.5 MCG/ACT IN AERO: 2.0000 | INHALATION_SPRAY | Freq: Two times a day (BID) | RESPIRATORY_TRACT | 0 refills | Status: AC

## 2023-09-04 MED ORDER — CEFDINIR 300 MG PO CAPS: 300.0000 mg | ORAL_CAPSULE | Freq: Two times a day (BID) | ORAL | 0 refills | Status: AC

## 2023-09-04 MED ORDER — BENZONATATE 100 MG PO CAPS: 100.0000 mg | ORAL_CAPSULE | Freq: Three times a day (TID) | ORAL | 0 refills | Status: AC

## 2023-09-04 MED ORDER — SPIRIVA RESPIMAT 2.5 MCG/ACT IN AERS: 2.0000 | INHALATION_SPRAY | Freq: Every day | RESPIRATORY_TRACT | 0 refills | Status: AC

## 2023-09-04 MED ORDER — IPRATROPIUM-ALBUTEROL 0.5-2.5 (3) MG/3ML IN SOLN
3.0000 mL | Freq: Once | RESPIRATORY_TRACT | Status: AC
  Administered 2023-09-04: 3 mL via RESPIRATORY_TRACT

## 2023-09-04 NOTE — ED Provider Notes (Signed)
 [facility]    CSN: [id number] Arrival date & time: 09/04/23  1240      History   Chief Complaint Chief Complaint  Patient presents with   Cough   Nasal Congestion    HPI Trevor Young is a 63 y.o. male.   Patient presents today with a 4-day history of URI symptoms.  He reports cough, congestion, headache.  Denies any fever, chest pain, shortness of breath but has had an increase in wheezing.  He has tried previously prescribed amoxicillin for total of 3 doses which improved but did not resolve symptoms.  He has not been taking any over-the-counter medication for symptom management.  He is a current everyday smoker and has a history of COPD but only uses albuterol as needed.  He last took this earlier today with temporary improvement of symptoms.  He denies additional antibiotic use in the past 90 days outside of 3 doses of amoxicillin from previous prescription.  Denies any recent steroids.  He does have a history of diabetes with last A1c 8.1% on 08/13/2023.  He has had COVID several years ago.  He has not had COVID or influenza vaccines.  Denies any known sick contacts but is around many people at work.    Past Medical History:  Diagnosis Date   Anemia    h/o   Anxiety    Arthritis    hands   Chronic kidney disease    kidney stone    Colon polyp 2016   COPD (chronic obstructive pulmonary disease) (HCC)    no inhalers   Depression    Diabetes mellitus without complication (HCC)    Hemorrhoid    Hyperlipidemia    Sleep apnea    Uses C-Pap machine     Patient Active Problem List   Diagnosis Date Noted   Recurrent major depressive disorder, in full remission (HCC) 08/11/2017   Perennial allergic rhinitis 08/11/2017   Sinusitis, chronic 06/18/2015   Hyperlipidemia 11/22/2014   Dyslipidemia associated with type 2 diabetes mellitus (HCC) 09/22/2014   Adiposity 09/22/2014   Obstructive sleep apnea 09/22/2014   Compulsive tobacco user syndrome 09/22/2014     Past Surgical History:  Procedure Laterality Date   COLONOSCOPY  2016   COLONOSCOPY N/A 08/20/2023   Procedure: COLONOSCOPY;  Surgeon: Jaynie Collins, DO;  Location: [facility] ENDOSCOPY;  Service: Gastroenterology;  Laterality: N/A;   LIPOMA EXCISION  1984   NASAL FRACTURE SURGERY  1988   POLYPECTOMY  08/20/2023   Procedure: POLYPECTOMY;  Surgeon: Jaynie Collins, DO;  Location: [facility] ENDOSCOPY;  Service: Gastroenterology;;   UMBILICAL HERNIA REPAIR N/A 04/15/2015   Procedure: HERNIA REPAIR UMBILICAL ADULT;  Surgeon: Kieth Brightly, MD;  Location: [facility] ORS;  Service: General;  Laterality: N/A;       Home Medications    Prior to Admission medications   Medication Sig Start Date End Date Taking? Authorizing Provider  albuterol (VENTOLIN HFA) 108 (90 Base) MCG/ACT inhaler Inhale 1-2 puffs into the lungs every 6 (six) hours as needed for wheezing or shortness of breath. 09/04/23  Yes Kasumi Ditullio K, PA-C  benzonatate (TESSALON) 100 MG capsule Take 1 capsule (100 mg total) by mouth every 8 (eight) hours. 09/04/23  Yes Camya Haydon, Denny Peon K, PA-C  budesonide-formoterol (SYMBICORT) 80-4.5 MCG/ACT inhaler Inhale 2 puffs into the lungs in the morning and at bedtime. 09/04/23  Yes Destane Speas K, PA-C  cefdinir (OMNICEF) 300 MG capsule Take 1 capsule (300 mg total) by mouth 2 (two)  times daily. 09/04/23  Yes Omolara Carol K, PA-C  citalopram (CELEXA) 20 MG tablet Take 1 tablet (20 mg total) by mouth daily. 08/11/17   Alba Cory, MD  fluticasone (FLONASE) 50 MCG/ACT nasal spray Place 2 sprays into both nostrils daily. 08/11/17   Alba Cory, MD  glipiZIDE (GLUCOTROL XL) 10 MG 24 hr tablet Take 10 mg by mouth daily.    [provider]  glucose blood test strip 1 strip 2 (two) times daily. Use as directed to check BG BID 04/14/13   Ellyn Hack, MD  hydrOXYzine (ATARAX/VISTARIL) 10 MG tablet Take 1 tablet (10 mg total) by mouth 3 (three) times daily as needed. 08/11/17    Alba Cory, MD  ibuprofen (ADVIL,MOTRIN) 200 MG tablet Take 200 mg by mouth every 6 (six) hours as needed.    [provider]  metFORMIN (GLUCOPHAGE-XR) 750 MG 24 hr tablet Take 1 tablet (750 mg total) by mouth daily with breakfast. 08/11/17   Alba Cory, MD  rosuvastatin (CRESTOR) 20 MG tablet Take 1 tablet (20 mg total) by mouth at bedtime. 08/11/17   Alba Cory, MD  Tiotropium Bromide Monohydrate (SPIRIVA RESPIMAT) 2.5 MCG/ACT AERS Inhale 2 puffs into the lungs daily. 09/04/23   Helyn Schwan, Noberto Retort, PA-C    Family History Family History  Problem Relation Age of Onset   Hypertension Mother    Diabetes Mother    Hypertension Father    Diabetes Brother    Diabetes Maternal Grandmother     Social History Social History   Tobacco Use   Smoking status: Every Day    Current packs/day: 0.50    Average packs/day: 0.5 packs/day for 45.0 years (22.5 ttl pk-yrs)    Types: Cigarettes   Smokeless tobacco: Former  Building services engineer status: Never Used  Substance Use Topics   Alcohol use: Yes    Alcohol/week: 0.0 standard drinks of alcohol    Comment: occasionally   Drug use: Yes    Types: Marijuana     Allergies   Codeine, Other, and Sulfa antibiotics   Review of Systems Review of Systems  Constitutional:  Positive for activity change and fatigue. Negative for appetite change and fever.  HENT:  Positive for congestion. Negative for sinus pressure, sneezing and sore throat.   Respiratory:  Positive for cough, chest tightness and wheezing. Negative for shortness of breath.   Cardiovascular:  Negative for chest pain.  Gastrointestinal:  Negative for abdominal pain, diarrhea, nausea and vomiting.  Musculoskeletal:  Negative for arthralgias and myalgias.  Neurological:  Positive for headaches. Negative for dizziness and light-headedness.     Physical Exam Triage Vital Signs ED Triage Vitals  Encounter Vitals Group     BP 09/04/23 1342 131/84     Systolic BP  Percentile --      Diastolic BP Percentile --      Pulse Rate 09/04/23 1342 79     Resp 09/04/23 1342 16     Temp 09/04/23 1342 99.3 F (37.4 C)     Temp Source 09/04/23 1342 Oral     SpO2 09/04/23 1342 98 %     Weight 09/04/23 1341 199 lb 11.8 oz (90.6 kg)     Height 09/04/23 1341 5\' 8"  (1.727 m)     Head Circumference --      Peak Flow --      Pain Score 09/04/23 1340 1     Pain Loc --      Pain Education --  Exclude from Growth Chart --    No data found.  Updated Vital Signs BP 131/84 (BP Location: Left Arm)   Pulse 79   Temp 99.3 F (37.4 C) (Oral)   Resp 16   Ht 5\' 8"  (1.727 m)   Wt 199 lb 11.8 oz (90.6 kg)   SpO2 98%   BMI 30.37 kg/m   Visual Acuity Right Eye Distance:   Left Eye Distance:   Bilateral Distance:    Right Eye Near:   Left Eye Near:    Bilateral Near:     Physical Exam Vitals reviewed.  Constitutional:      General: He is awake.     Appearance: Normal appearance. He is well-developed. He is not ill-appearing.     Comments: Very pleasant male appears stated age in no acute distress sitting comfortably in exam room  HENT:     Head: Normocephalic and atraumatic.     Right Ear: Tympanic membrane, ear canal and external ear normal. Tympanic membrane is not erythematous or bulging.     Left Ear: Tympanic membrane, ear canal and external ear normal. Tympanic membrane is not erythematous or bulging.     Nose: Nose normal.     Mouth/Throat:     Pharynx: Uvula midline. Posterior oropharyngeal erythema present. No oropharyngeal exudate or uvula swelling.  Cardiovascular:     Rate and Rhythm: Normal rate and regular rhythm.     Heart sounds: Normal heart sounds, S1 normal and S2 normal. No murmur heard. Pulmonary:     Effort: Pulmonary effort is normal. No accessory muscle usage or respiratory distress.     Breath sounds: No stridor. Wheezing present. No rhonchi or rales.  Neurological:     Mental Status: He is alert.  Psychiatric:         Behavior: Behavior is cooperative.      UC Treatments / Results  Labs (all labs ordered are listed, but only abnormal results are displayed) Labs Reviewed  RESP PANEL BY RT-PCR (FLU A&B, COVID) ARPGX2 - Abnormal; Notable for the following components:      Result Value   Influenza A by PCR POSITIVE (*)    All other components within normal limits    EKG   Radiology DG Chest 2 View Result Date: 09/04/2023 CLINICAL DATA:  Cough EXAM: CHEST - 2 VIEW COMPARISON:  03/31/2023 FINDINGS: The heart size and mediastinal contours are within normal limits. Hyperinflated lungs with mildly coarsened interstitial markings. No focal airspace consolidation, pleural effusion, or pneumothorax. The visualized skeletal structures are unremarkable. IMPRESSION: COPD.  No active cardiopulmonary disease. Electronically Signed   By: Duanne Guess D.O.   On: 09/04/2023 15:01    Procedures Procedures (including critical care time)  Medications Ordered in UC Medications  ipratropium-albuterol (DUONEB) 0.5-2.5 (3) MG/3ML nebulizer solution 3 mL (3 mLs Nebulization Given 09/04/23 1449)    Initial Impression / Assessment and Plan / UC Course  I have reviewed the triage vital signs and the nursing notes.  Pertinent labs & imaging results that were available during my care of the patient were reviewed by me and considered in my medical decision making (see chart for details).     Patient is well-appearing, afebrile, nontoxic, nontachycardic.  Viral testing was obtained in triage and he does a positive for influenza A.  He has been symptomatic for 4 days so outside the window of effectiveness for Tamiflu.  He does report a few days of increasing cough and congestion and so x-ray was  obtained in the setting of COPD that showed no acute cardiopulmonary disease.  He was given a DuoNeb with improvement of symptoms and we discussed that symptoms are concerning for COPD exacerbation likely triggered by influenza A.   He was started on Omnicef to cover for COPD exacerbation as well as several inhalers including albuterol for rescue, Spiriva, Symbicort.  We discussed that he should rinse his mouth following use of Symbicort to prevent thrush.  Will defer systemic steroids in the hopes that inhaled corticosteroids and Symbicort are sufficient to manage his symptoms as he does have a history of diabetes that is not adequately controlled.  Will consider systemic steroids if his symptoms persist or change.  We discussed that if he is not feeling significantly better within a few days or if anything worsens he needs to be seen immediately.  He was given Tessalon for cough.  We discussed that if anything worsens or changes he needs to be seen immediately.  Strict return precautions given.  All questions were answered to patient satisfaction.  Excuse note provided.  Final Clinical Impressions(s) / UC Diagnoses   Final diagnoses:  Acute cough  COPD exacerbation (HCC)  Influenza A     Discharge Instructions      You tested positive for flu.  You are outside the window where we would use Tamiflu.  I am concerned that this is triggered your COPD.  Start Omnicef twice daily.  Use albuterol every 4-6 hours as needed for shortness of breath and coughing fits.  Use Tessalon for cough.  Start Spiriva 1 time daily and Symbicort twice daily to help keep your lungs open.  Rinse your mouth following use of Symbicort as this can cause thrush.  If your symptoms are not improving quickly please follow-up within a few days.  If anything worsens you need to be seen immediately.     ED Prescriptions     Medication Sig Dispense Auth. Provider   Tiotropium Bromide Monohydrate (SPIRIVA RESPIMAT) 2.5 MCG/ACT AERS Inhale 2 puffs into the lungs daily. 4 g Evellyn Tuff K, PA-C   benzonatate (TESSALON) 100 MG capsule Take 1 capsule (100 mg total) by mouth every 8 (eight) hours. 21 capsule Dylanie Quesenberry K, PA-C   cefdinir (OMNICEF) 300 MG  capsule Take 1 capsule (300 mg total) by mouth 2 (two) times daily. 14 capsule Hence Derrick K, PA-C   albuterol (VENTOLIN HFA) 108 (90 Base) MCG/ACT inhaler Inhale 1-2 puffs into the lungs every 6 (six) hours as needed for wheezing or shortness of breath. 18 g Keir Viernes K, PA-C   budesonide-formoterol (SYMBICORT) 80-4.5 MCG/ACT inhaler Inhale 2 puffs into the lungs in the morning and at bedtime. 1 each Emslee Lopezmartinez, Noberto Retort, PA-C      PDMP not reviewed this encounter.   Jeani Hawking, PA-C 09/04/23 1520

## 2023-09-04 NOTE — Discharge Instructions (Addendum)
 You tested positive for flu.  You are outside the window where we would use Tamiflu.  I am concerned that this is triggered your COPD.  Start Omnicef twice daily.  Use albuterol every 4-6 hours as needed for shortness of breath and coughing fits.  Use Tessalon for cough.  Start Spiriva 1 time daily and Symbicort twice daily to help keep your lungs open.  Rinse your mouth following use of Symbicort as this can cause thrush.  If your symptoms are not improving quickly please follow-up within a few days.  If anything worsens you need to be seen immediately.

## 2023-09-04 NOTE — ED Triage Notes (Signed)
 Patient reports cough, nasal congestion and headaches that started on Thursday.  Patient states that he took an old dose of Amoxicillin for 3 days and has not felt better.
